# Patient Record
Sex: Male | Born: 1935 | Race: White | Hispanic: No | Smoking: Never smoker
Health system: Southern US, Community
[De-identification: ages and names within clinical notes are randomized; demographics above are authoritative.]

## PROBLEM LIST (undated history)

## (undated) DIAGNOSIS — K219 Gastro-esophageal reflux disease without esophagitis: Secondary | ICD-10-CM

## (undated) DIAGNOSIS — J45909 Unspecified asthma, uncomplicated: Secondary | ICD-10-CM

## (undated) DIAGNOSIS — R011 Cardiac murmur, unspecified: Secondary | ICD-10-CM

## (undated) DIAGNOSIS — I10 Essential (primary) hypertension: Secondary | ICD-10-CM

## (undated) DIAGNOSIS — E7800 Pure hypercholesterolemia, unspecified: Secondary | ICD-10-CM

## (undated) DIAGNOSIS — I2510 Atherosclerotic heart disease of native coronary artery without angina pectoris: Secondary | ICD-10-CM

## (undated) DIAGNOSIS — N400 Enlarged prostate without lower urinary tract symptoms: Secondary | ICD-10-CM

## (undated) DIAGNOSIS — I350 Nonrheumatic aortic (valve) stenosis: Secondary | ICD-10-CM

## (undated) HISTORY — DX: Unspecified asthma, uncomplicated: J45.909

## (undated) HISTORY — DX: Gastro-esophageal reflux disease without esophagitis: K21.9

## (undated) HISTORY — DX: Nonrheumatic aortic (valve) stenosis: I35.0

## (undated) HISTORY — PX: OTHER SURGICAL HISTORY: SHX169

## (undated) HISTORY — DX: Cardiac murmur, unspecified: R01.1

## (undated) HISTORY — DX: Atherosclerotic heart disease of native coronary artery without angina pectoris: I25.10

## (undated) HISTORY — DX: Benign prostatic hyperplasia without lower urinary tract symptoms: N40.0

## (undated) HISTORY — PX: CARDIAC CATHETERIZATION: SHX172

---

## 2001-07-15 ENCOUNTER — Emergency Department (HOSPITAL_COMMUNITY): Admission: EM | Admit: 2001-07-15 | Discharge: 2001-07-15 | Payer: Self-pay | Admitting: Emergency Medicine

## 2001-07-15 ENCOUNTER — Encounter: Payer: Self-pay | Admitting: Emergency Medicine

## 2001-07-31 ENCOUNTER — Ambulatory Visit (HOSPITAL_COMMUNITY): Admission: RE | Admit: 2001-07-31 | Discharge: 2001-07-31 | Payer: Self-pay | Admitting: Internal Medicine

## 2008-07-31 ENCOUNTER — Ambulatory Visit (HOSPITAL_COMMUNITY): Admission: RE | Admit: 2008-07-31 | Discharge: 2008-07-31 | Payer: Self-pay | Admitting: Family Medicine

## 2008-09-22 DIAGNOSIS — K219 Gastro-esophageal reflux disease without esophagitis: Secondary | ICD-10-CM

## 2008-09-22 DIAGNOSIS — Z8719 Personal history of other diseases of the digestive system: Secondary | ICD-10-CM

## 2008-09-22 DIAGNOSIS — R972 Elevated prostate specific antigen [PSA]: Secondary | ICD-10-CM | POA: Insufficient documentation

## 2008-09-22 DIAGNOSIS — K649 Unspecified hemorrhoids: Secondary | ICD-10-CM | POA: Insufficient documentation

## 2008-09-23 ENCOUNTER — Ambulatory Visit: Payer: Self-pay | Admitting: Internal Medicine

## 2008-09-24 ENCOUNTER — Encounter: Payer: Self-pay | Admitting: Internal Medicine

## 2008-10-19 ENCOUNTER — Ambulatory Visit: Payer: Self-pay | Admitting: Internal Medicine

## 2008-10-19 ENCOUNTER — Ambulatory Visit (HOSPITAL_COMMUNITY): Admission: RE | Admit: 2008-10-19 | Discharge: 2008-10-19 | Payer: Self-pay | Admitting: Internal Medicine

## 2008-12-31 ENCOUNTER — Emergency Department (HOSPITAL_COMMUNITY): Admission: EM | Admit: 2008-12-31 | Discharge: 2008-12-31 | Payer: Self-pay | Admitting: Emergency Medicine

## 2009-01-15 ENCOUNTER — Emergency Department (HOSPITAL_COMMUNITY): Admission: EM | Admit: 2009-01-15 | Discharge: 2009-01-15 | Payer: Self-pay | Admitting: Emergency Medicine

## 2009-01-21 ENCOUNTER — Encounter (INDEPENDENT_AMBULATORY_CARE_PROVIDER_SITE_OTHER): Payer: Self-pay | Admitting: Urology

## 2009-01-21 ENCOUNTER — Inpatient Hospital Stay (HOSPITAL_COMMUNITY): Admission: RE | Admit: 2009-01-21 | Discharge: 2009-01-24 | Payer: Self-pay | Admitting: Urology

## 2009-02-06 ENCOUNTER — Emergency Department (HOSPITAL_COMMUNITY): Admission: EM | Admit: 2009-02-06 | Discharge: 2009-02-06 | Payer: Self-pay | Admitting: Emergency Medicine

## 2009-02-11 ENCOUNTER — Emergency Department (HOSPITAL_COMMUNITY): Admission: EM | Admit: 2009-02-11 | Discharge: 2009-02-11 | Payer: Self-pay | Admitting: Emergency Medicine

## 2009-02-12 ENCOUNTER — Encounter (HOSPITAL_COMMUNITY): Admission: RE | Admit: 2009-02-12 | Discharge: 2009-04-23 | Payer: Self-pay | Admitting: Family Medicine

## 2009-02-12 ENCOUNTER — Inpatient Hospital Stay (HOSPITAL_COMMUNITY): Admission: EM | Admit: 2009-02-12 | Discharge: 2009-02-15 | Payer: Self-pay | Admitting: Emergency Medicine

## 2009-02-27 ENCOUNTER — Emergency Department (HOSPITAL_COMMUNITY): Admission: EM | Admit: 2009-02-27 | Discharge: 2009-02-27 | Payer: Self-pay | Admitting: Emergency Medicine

## 2009-03-17 ENCOUNTER — Emergency Department (HOSPITAL_COMMUNITY): Admission: EM | Admit: 2009-03-17 | Discharge: 2009-03-17 | Payer: Self-pay | Admitting: Emergency Medicine

## 2010-05-15 ENCOUNTER — Encounter: Payer: Self-pay | Admitting: Internal Medicine

## 2010-07-27 LAB — URINE CULTURE
Colony Count: NO GROWTH
Culture: NO GROWTH
Culture: NO GROWTH

## 2010-07-27 LAB — URINALYSIS, ROUTINE W REFLEX MICROSCOPIC
Bilirubin Urine: NEGATIVE
Bilirubin Urine: NEGATIVE
Glucose, UA: NEGATIVE mg/dL
Glucose, UA: NEGATIVE mg/dL
Ketones, ur: NEGATIVE mg/dL
Nitrite: NEGATIVE
Protein, ur: 30 mg/dL — AB
Specific Gravity, Urine: 1.025 (ref 1.005–1.030)
Specific Gravity, Urine: >1.03 — ABNORMAL HIGH (ref 1.005–1.030)
Urobilinogen, UA: 0.2 mg/dL (ref 0.0–1.0)
pH: 5.5 (ref 5.0–8.0)
pH: 6 (ref 5.0–8.0)

## 2010-07-27 LAB — BASIC METABOLIC PANEL WITH GFR
CO2: 23 meq/L (ref 19–32)
Chloride: 101 meq/L (ref 96–112)
GFR calc Af Amer: >60 mL/min (ref >60)
Potassium: 3.7 meq/L (ref 3.5–5.1)
Sodium: 130 meq/L — ABNORMAL LOW (ref 135–145)

## 2010-07-27 LAB — DIFFERENTIAL
Basophils Absolute: 0.1 10*3/uL (ref 0.0–0.1)
Basophils Absolute: 0 10*3/uL (ref 0.0–0.1)
Basophils Relative: 0 % (ref 0–1)
Eosinophils Absolute: 0 10*3/uL (ref 0.0–0.7)
Eosinophils Absolute: 0.1 K/uL (ref 0.0–0.7)
Eosinophils Relative: 0 % (ref 0–5)
Eosinophils Relative: 1 % (ref 0–5)
Lymphocytes Relative: 7 % — ABNORMAL LOW (ref 12–46)
Lymphocytes Relative: 8 % — ABNORMAL LOW (ref 12–46)
Lymphs Abs: 0.7 10*3/uL (ref 0.7–4.0)
Lymphs Abs: 0.6 10*3/uL — ABNORMAL LOW (ref 0.7–4.0)
Monocytes Absolute: 0.9 10*3/uL (ref 0.1–1.0)
Monocytes Absolute: 0.8 K/uL (ref 0.1–1.0)
Monocytes Relative: 10 % (ref 3–12)
Neutro Abs: 6.8 10*3/uL (ref 1.7–7.7)
Neutrophils Relative %: 81 % — ABNORMAL HIGH (ref 43–77)

## 2010-07-27 LAB — URINE MICROSCOPIC-ADD ON

## 2010-07-27 LAB — COMPREHENSIVE METABOLIC PANEL
ALT: 12 U/L (ref 0–53)
AST: 15 U/L (ref 0–37)
Albumin: 2.9 g/dL — ABNORMAL LOW (ref 3.5–5.2)
CO2: 27 mEq/L (ref 19–32)
Chloride: 109 mEq/L (ref 96–112)
GFR calc Af Amer: >60 mL/min (ref >60)
GFR calc non Af Amer: >60 mL/min (ref >60)
Sodium: 139 mEq/L (ref 135–145)
Total Bilirubin: 0.5 mg/dL (ref 0.3–1.2)

## 2010-07-27 LAB — BASIC METABOLIC PANEL
BUN: 24 mg/dL — ABNORMAL HIGH (ref 6–23)
Calcium: 8.5 mg/dL (ref 8.4–10.5)
Creatinine, Ser: 1.11 mg/dL (ref 0.4–1.5)
GFR calc non Af Amer: >60 mL/min (ref >60)
Glucose, Bld: 138 mg/dL — ABNORMAL HIGH (ref 70–99)

## 2010-07-27 LAB — CBC
HCT: 37.8 % — ABNORMAL LOW (ref 39.0–52.0)
Hemoglobin: 13.1 g/dL (ref 13.0–17.0)
MCHC: 34.7 g/dL (ref 30.0–36.0)
MCV: 88.5 fL (ref 78.0–100.0)
Platelets: 160 10*3/uL (ref 150–400)
Platelets: 188 10*3/uL (ref 150–400)
RBC: 3.82 MIL/uL — ABNORMAL LOW (ref 4.22–5.81)
RBC: 4.27 MIL/uL (ref 4.22–5.81)
RDW: 14.7 % (ref 11.5–15.5)
WBC: 10.4 10*3/uL (ref 4.0–10.5)
WBC: 8.3 10*3/uL (ref 4.0–10.5)

## 2010-07-28 LAB — DIFFERENTIAL
Basophils Absolute: 0 K/uL (ref 0.0–0.1)
Basophils Relative: 0 % (ref 0–1)
Eosinophils Absolute: 0.2 K/uL (ref 0.0–0.7)
Eosinophils Relative: 2 % (ref 0–5)
Eosinophils Relative: 1 % (ref 0–5)
Eosinophils Relative: 3 % (ref 0–5)
Lymphocytes Relative: 13 % (ref 12–46)
Lymphocytes Relative: 22 % (ref 12–46)
Lymphocytes Relative: 15 % (ref 12–46)
Lymphs Abs: 0.8 10*3/uL (ref 0.7–4.0)
Lymphs Abs: 1.6 10*3/uL (ref 0.7–4.0)
Lymphs Abs: 1.1 K/uL (ref 0.7–4.0)
Monocytes Absolute: 0.7 10*3/uL (ref 0.1–1.0)
Monocytes Absolute: 0.9 K/uL (ref 0.1–1.0)
Monocytes Relative: 12 % (ref 3–12)
Neutro Abs: 5.3 K/uL (ref 1.7–7.7)
Neutrophils Relative %: 70 % (ref 43–77)

## 2010-07-28 LAB — COMPREHENSIVE METABOLIC PANEL
AST: 18 U/L (ref 0–37)
AST: 18 U/L (ref 0–37)
Albumin: 3.4 g/dL — ABNORMAL LOW (ref 3.5–5.2)
Alkaline Phosphatase: 56 U/L (ref 39–117)
BUN: 15 mg/dL (ref 6–23)
CO2: 26 mEq/L (ref 19–32)
Calcium: 9.1 mg/dL (ref 8.4–10.5)
Creatinine, Ser: 1.05 mg/dL (ref 0.4–1.5)
Creatinine, Ser: 1.09 mg/dL (ref 0.4–1.5)
GFR calc Af Amer: >60 mL/min (ref >60)
GFR calc Af Amer: >60 mL/min (ref >60)
GFR calc non Af Amer: >60 mL/min (ref >60)
Potassium: 4 mEq/L (ref 3.5–5.1)
Total Protein: 6.8 g/dL (ref 6.0–8.3)

## 2010-07-28 LAB — SEDIMENTATION RATE: Sed Rate: 25 mm/hr — ABNORMAL HIGH (ref 0–16)

## 2010-07-28 LAB — CBC
HCT: 39 % (ref 39.0–52.0)
HCT: 39.3 % (ref 39.0–52.0)
HCT: 36 % — ABNORMAL LOW (ref 39.0–52.0)
Hemoglobin: 13.5 g/dL (ref 13.0–17.0)
Hemoglobin: 12.6 g/dL — ABNORMAL LOW (ref 13.0–17.0)
MCHC: 34.7 g/dL (ref 30.0–36.0)
MCHC: 35.2 g/dL (ref 30.0–36.0)
MCV: 88.7 fL (ref 78.0–100.0)
MCV: 88 fL (ref 78.0–100.0)
Platelets: 249 10*3/uL (ref 150–400)
Platelets: 315 10*3/uL (ref 150–400)
Platelets: 150 K/uL (ref 150–400)
RBC: 4.6 MIL/uL (ref 4.22–5.81)
RBC: 4.09 MIL/uL — ABNORMAL LOW (ref 4.22–5.81)
RDW: 12.9 % (ref 11.5–15.5)
RDW: 13.4 % (ref 11.5–15.5)
WBC: 6.4 10*3/uL (ref 4.0–10.5)
WBC: 7.6 K/uL (ref 4.0–10.5)

## 2010-07-28 LAB — URINALYSIS, ROUTINE W REFLEX MICROSCOPIC
Bilirubin Urine: NEGATIVE
Bilirubin Urine: NEGATIVE
Glucose, UA: NEGATIVE mg/dL
Glucose, UA: NEGATIVE mg/dL
Ketones, ur: NEGATIVE mg/dL
Ketones, ur: 15 mg/dL — AB
Ketones, ur: 15 mg/dL — AB
Nitrite: NEGATIVE
Nitrite: NEGATIVE
Protein, ur: 100 mg/dL — AB
Protein, ur: 100 mg/dL — AB
Specific Gravity, Urine: 1.01 (ref 1.005–1.030)
Specific Gravity, Urine: 1.019 (ref 1.005–1.030)
Specific Gravity, Urine: 1.02 (ref 1.005–1.030)
Urobilinogen, UA: 0.2 mg/dL (ref 0.0–1.0)
Urobilinogen, UA: 0.2 mg/dL (ref 0.0–1.0)
Urobilinogen, UA: 0.2 mg/dL (ref 0.0–1.0)
pH: 6 (ref 5.0–8.0)
pH: 6 (ref 5.0–8.0)

## 2010-07-28 LAB — POCT CARDIAC MARKERS
CKMB, poc: <1 ng/mL — ABNORMAL LOW (ref 1.0–8.0)
Myoglobin, poc: 89.3 ng/mL (ref 12–200)
Troponin i, poc: <0.05 ng/mL (ref 0.00–0.09)

## 2010-07-28 LAB — URINE CULTURE: Colony Count: 50000

## 2010-07-28 LAB — BASIC METABOLIC PANEL
BUN: 15 mg/dL (ref 6–23)
BUN: 10 mg/dL (ref 6–23)
CO2: 25 mEq/L (ref 19–32)
CO2: 28 mEq/L (ref 19–32)
Calcium: 8.3 mg/dL — ABNORMAL LOW (ref 8.4–10.5)
Chloride: 99 mEq/L (ref 96–112)
Chloride: 99 mEq/L (ref 96–112)
Creatinine, Ser: 1.15 mg/dL (ref 0.4–1.5)
Creatinine, Ser: 0.89 mg/dL (ref 0.4–1.5)
GFR calc non Af Amer: >60 mL/min (ref >60)
Glucose, Bld: 109 mg/dL — ABNORMAL HIGH (ref 70–99)
Glucose, Bld: 126 mg/dL — ABNORMAL HIGH (ref 70–99)
Glucose, Bld: 81 mg/dL (ref 70–99)
Potassium: 3.5 mEq/L (ref 3.5–5.1)
Sodium: 134 mEq/L — ABNORMAL LOW (ref 135–145)

## 2010-07-28 LAB — URINE MICROSCOPIC-ADD ON

## 2010-07-28 LAB — PROTIME-INR
INR: 1 (ref 0.00–1.49)
Prothrombin Time: 13.1 s (ref 11.6–15.2)

## 2010-07-28 LAB — CA 125: CA 125: 10.1 U/mL (ref 0.0–30.2)

## 2010-07-28 LAB — PSA: PSA: 10.02 ng/mL — ABNORMAL HIGH (ref 0.10–4.00)

## 2010-07-28 LAB — CANCER ANTIGEN 19-9: CA 19-9: 16.7 U/mL — ABNORMAL LOW

## 2010-07-28 LAB — CEA: CEA: <0.5 ng/mL (ref 0.0–5.0)

## 2010-07-28 LAB — T3, FREE: T3, Free: 3.1 pg/mL (ref 2.3–4.2)

## 2010-07-28 LAB — T4, FREE: Free T4: 1.33 ng/dL (ref 0.80–1.80)

## 2010-07-28 LAB — TSH
TSH: 0.636 u[IU]/mL (ref 0.350–4.500)
TSH: 0.766 u[IU]/mL (ref 0.350–4.500)

## 2010-07-29 LAB — BASIC METABOLIC PANEL
BUN: 14 mg/dL (ref 6–23)
Chloride: 105 mEq/L (ref 96–112)
Potassium: 4.3 mEq/L (ref 3.5–5.1)
Sodium: 136 mEq/L (ref 135–145)

## 2010-07-29 LAB — URINALYSIS, ROUTINE W REFLEX MICROSCOPIC
Bilirubin Urine: NEGATIVE
Bilirubin Urine: NEGATIVE
Ketones, ur: NEGATIVE mg/dL
Nitrite: NEGATIVE
Nitrite: NEGATIVE
Protein, ur: NEGATIVE mg/dL
Specific Gravity, Urine: 1.01 (ref 1.005–1.030)
Specific Gravity, Urine: 1.01 (ref 1.005–1.030)
Urobilinogen, UA: 0.2 mg/dL (ref 0.0–1.0)
Urobilinogen, UA: 0.2 mg/dL (ref 0.0–1.0)

## 2010-07-29 LAB — CBC
HCT: 41.2 % (ref 39.0–52.0)
Hemoglobin: 14.4 g/dL (ref 13.0–17.0)
MCV: 89 fL (ref 78.0–100.0)
RBC: 4.63 MIL/uL (ref 4.22–5.81)
WBC: 6.6 10*3/uL (ref 4.0–10.5)

## 2010-07-29 LAB — URINE CULTURE: Culture: NO GROWTH

## 2010-08-01 LAB — URINALYSIS, ROUTINE W REFLEX MICROSCOPIC
Bilirubin Urine: NEGATIVE
Leukocytes, UA: NEGATIVE
Nitrite: NEGATIVE
Specific Gravity, Urine: 1.01 (ref 1.005–1.030)
Urobilinogen, UA: 0.2 mg/dL (ref 0.0–1.0)
pH: 5.5 (ref 5.0–8.0)

## 2010-08-01 LAB — URINE MICROSCOPIC-ADD ON

## 2010-09-06 NOTE — Op Note (Signed)
NAME:  Ryan Hansen, Ryan Hansen                 ACCOUNT NO.:  [id number]   MEDICAL RECORD NO.:  [id number]          PATIENT TYPE:  AMB   LOCATION:  DAY                           FACILITY:  [facility]   PHYSICIAN:  R. Roetta Sessions, M.D. DATE OF BIRTH:  01/13/1929   DATE OF PROCEDURE:  10/19/2008  DATE OF DISCHARGE:                               OPERATIVE REPORT   PROCEDURE PERFORMED:  Ileocolonoscopy diagnostic.   INDICATIONS FOR PROCEDURE:  A 77 year old gentleman with intermittent  hematochezia when he strains.  He does take Metamucil daily.  Last  colonoscopy was in 2003.  He has had some left-sided proctocolitis at  that time and diverticulosis.  Colonoscopy is now being done.  Risks,  benefits, alternatives and limitations have been reviewed, questions  answered.  In the past passed 24 hours, he has had some voiding  difficulties and has had significant increased urinary urgency over the  past 12 hours but no dysuria, no flank pain, no fever.  He has a history  of BPH and is followed by Dr. Rito Ehrlich.  Preop we obtained urine  specimen and placed a Foley catheter for the procedure as he had the  urge to go urinate about every minute.   He had 300 mL of urine in his bladder on postvoid residual.  His  urinalysis came back normal.  This approach has discussed Mr. Langenbach.  Risks, benefits, alternatives and limitations have been reviewed,  questions answered.  Please see documentation in the medical record.   PROCEDURE NOTE:  O2 saturation, blood pressure, pulse and respirations  monitored throughout the entire procedure.  Conscious sedation was  Versed 4 mg IV, Demerol 75 mg IV in divided doses.  Instrument was  Pentax video chip system.   FINDINGS:  Digital rectal exam revealed a friable anal canal and a  single hemorrhoidal tag.  The prep was adequate.  Colon:  Colonic mucosa was surveyed from the rectosigmoid junction  through the left, transverse, right colon to the area of appendiceal  orifice, ileocecal valve/cecum.  These structures were well seen and  photographed for the record.  Terminal ileum was intubated 10 cm.  From  this level scope was slowly and cautiously withdrawn.  All previously  mentioned mucosal surfaces were again seen.  The patient had left-sided  transverse diverticula, some subtle submucosal petechiae around a few  sigmoid diverticula.  Remainder of colonic mucosa appeared normal as did  terminal ileal mucosa.  Scope was pulled down the rectum where thorough  examination of the rectal mucosa including en face view of the anal  canal demonstrated a friable anal canal and a single hemorrhoidal tag  only.  The patient tolerated the procedure well, was reacted in  endoscopy.  Cecal withdrawal time 7 minutes.   IMPRESSION:  1. Friable anal canal with a single external/anal canal hemorrhoidal      tag, otherwise normal rectum.  2. Left-sided transverse diverticula with submucosal petechiae about      several sigmoid diverticula, doubtful clinical significance,      otherwise normal colon, normal terminal ileum.   RECOMMENDATIONS:  1. Continue Metamucil one dose daily.  2. Add Colace 100 mg orally twice daily as a stool softener, Anusol-HC      suppositories one per rectum at bedtime times 10 days.  3. MiraLax 17 grams orally bedtime p.r.n. constipation.  4. The patient is to notify me if rectal bleeding does not cease.  5. The patient is to see Dr. Rito Ehrlich as soon as can be arranged for      his voiding difficulties.      Jonathon Bellows, M.D.  Electronically Signed     RMR/MEDQ  D:  10/19/2008  T:  10/19/2008  Job:  [id number]   cc:   Dr. Wynona Neat, M.D.  Fax: [phone]

## 2010-09-09 NOTE — Op Note (Signed)
[facility]  Patient:    Ryan Hansen, Ryan Hansen Visit Number: [id number] MRN: [id number]          Service Type: DSU Location: DAY Attending Physician:  Jonathon Bellows Dictated by:   Roetta Sessions, M.D. Proc. Date: 07/31/01 Admit Date:  07/31/2001 Discharge Date: 07/31/2001   CC:         Scarlett Presto, M.D.   Operative Report  PROCEDURE:  Colonoscopy, biopsy, stool sampling.  ENDOSCOPIST:  Roetta Sessions, M.D.  INDICATIONS FOR PROCEDURE:  A 77 year old gentleman with recent episodes of rectal bleeding, has three to four soft bowel movements daily, but really denies frank diarrhea.  Colonoscopy is now being done to further evaluate his symptoms.  Approach has been discussed with Mr. Patchell yesterday in my office and, again, today at the bedside.  Potential risks, benefits, and alternatives have been reviewed.  He tells me he has "things to do" later today and wishes to not have any conscious sedation.  Please see my dictated H&P for more information.  PROCEDURE NOTE:  O2 saturation, blood pressure, pulse, and respirations were monitored throughout the entire procedure.  CONSCIOUS SEDATION:  None given.  INSTRUMENT:  Olympus video colonoscope.  FINDINGS:  Digital rectal examination revealed no abnormalities.  ______ findings - the prep was good.  On rectal mucosal examination, rectal mucosa was clear.  A retroflexed view of anal verge revealed no significant hemorrhoidal tissue.  However, the rectal mucosa was abnormal.  A normal vascular pattern was preserved.  However, there were "islands" of focal fairly intense erythema with little white pustules overlying them.  These were diffusely distributed throughout the rectal mucosa.  This was extended into the sigmoid colon to 35 cm.  More proximally, the mucosa appeared entirely normal except for sigmoid diverticula.  I was able to easily advance the scope to the cecum.  The patient tolerated the  advancement very well.  The cecum, ileocecal valve, appendiceal orifices were well seen and photographed for the record.  Stool residue was suctioned for microbiology studies from the level of the cecum and ileocecal valve.  The scope was slowly withdrawn.  All previously mucosal surfaces were again seen.  No other abnormalities were observed.  Biopsies of the sigmoid and rectal mucosa were taken for histologic study.  The patient tolerated the procedure extremely well and was discharged.  IMPRESSION: 1. Abnormal-appearing rectosigmoid to 35 cm as described above consistent with    endoscopic colitis.  Findings were nonspecific.  This could be idiopathic    or an infectious process.  Biopsies taken.  Stool sample taken. 2. Left-sided diverticula. 3. Remainder of the more proximal colon all the way to the cecum appeared    endoscopically normal.  RECOMMENDATIONS:  Follow-up on stool studies and biopsies.  Further recommendations to follow. Dictated by:   Roetta Sessions, M.D. Attending Physician:  Jonathon Bellows DD:  07/31/01 TD:  07/31/01 Job: [id number]

## 2010-09-09 NOTE — Consult Note (Signed)
[facility]  Patient:    Ryan Hansen, Ryan Hansen Visit Number: [id number] MRN: [id number]          Service Type: EMS Location: ED Attending Physician:  Hilario Quarry Dictated by:   Pearletha Furl Alanda Amass, M.D. Admit Date:  07/15/2001 Discharge Date: 07/15/2001   CC:         Scarlett Presto, M.D.  [facility] Patient Room   Consultation Report  HISTORY OF PRESENT ILLNESS:  The patient is a very pleasant, 77 year old white married father of 3 with 2 grandchildren. 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 His wife has been out of town over the last several days at [employer], and he has been living alone and eating and cooking for himself.  The patient is followed by Dr. Malvin Johns periodically as an outpatient, but he has not required any recent surgery. He is seen at the request of Dr. Malvin Johns for evaluation of an episode of chest pain.  The patient felt well when he went to bed last night. He ate a tomato sandwich with his dinner. He awakened approximately 3 a.m. with upper substernal pressure that was nonradiating. There was no nausea, vomiting or diaphoresis. He tried to take an antacid and sit up. That took about 20-30 minutes for the discomfort to wear off. He felt that it was probably indigestion, but he was concerned about it. He was able to go back to bed, and he saw Dr. Malvin Johns today. In view of his family history of coronary disease, it was felt best to send him over to the emergency room for further evaluation and cardiac consultation was requested through the courtesy of Dr. Malvin Johns.  The patient was pain free in the emergency room and stable hemodynamically.  PAST MEDICAL HISTORY:  The patient has twin brothers who are age 7, and both have required coronary stents in the past. He has no history of known hyperlipidemia.  There is no diabetes and no prior history to suggest coronary disease. He has not had any physiologic testing. He remains active and works part time as outlined above. He has not had any exertional symptoms.  He has had a history of intermittent reflux when he "eats the wrong food" that felt similar to this episode, however, it usually lasts for several seconds to minutes, so this was slightly more prolonged.  During the a.m. hours, the patient did take an aspirin.  PHYSICAL EXAMINATION:  VITAL SIGNS:  In the emergency room, he is stable with a blood pressure of 168/79 by the nurses. Repeat by me was 140/79. He was afebrile; pulse 70, respirations 24.  GENERAL:  He appears younger than his stated age of 40. He is euthyroid appearing and astenic. Color is good.  HEENT:  There were no xanthelasmas. Pupils are equal, round, and reactive to light. Extraocular movements are intact.  NECK:  Thyroid is not enlarged. There are no carotid bruits.  CHEST:  Clear to auscultation and percussion.  CARDIAC:  Reveals a localized LV heave in the left 6th ICS and the MCL. There is an S4 gallop that is audible but not palpable. There is no significant cardiac murmur, no S3, JVD, or HJ reflex.  EXTREMITIES:  His pedal pulses are intact. There is no edema. No leg or toe ulcerations. Femorals are intact without bruits.  ABDOMEN:  Liver, spleen and kidney not palpable. Abdomen is  nontender, specifically no right upper quadrant tenderness.  No pathologic reflexes.  RADIOLOGY:  EKG shows sinus rhythm and is essentially within normal limits.  LABORATORY AND ACCESSORY DATA:  Normal CPK of 144, normal MB of 1.3, and normal troponin of 0.02. The patient has been in the emergency room. H&H is normal, WBC 5.7, BUN/creatinine 14/0.9, potassium 4.2.  IMPRESSION:  The patient has had a single episode of chest pain. It is compatible with reflux and esophageal spasm and/or coronary. He is quite stable now.  There have been no recurrent episodes, and he has myocardial infarction essentially ruled out by serial enzymes and EKGs at this time now, almost 10 hours since his episode.  PLAN:  In view of the patients positive family history, I think we should have a further workup, and I think we can proceed without patient Cardiolite in this setting. In the interim because of his mild hypertension and possible suspicion of coronary disease, we will put him on low-dose beta blocker, Toprol XL 25, and add a PPI to his regimen along with aspirin. We will arrange for an outpatient Cardiolite to be done. If he has any recurrent episodes of pain, he is advised to call us and/or come to the emergency room and ask for our service. I have explained all of this to the patient in detail; further recommendations pending his outpatient Cardiolite and clinical status. Dictated by:   Pearletha Furl Alanda Amass, M.D. Attending Physician:  Hilario Quarry DD:  07/15/01 TD:  07/16/01 Job: [id number]

## 2010-09-09 NOTE — Consult Note (Signed)
[facility]  Patient:    Ryan Hansen, Ryan Hansen Visit Number: [id number] MRN: [id number]          Service Type: EMS Location: ED Attending Physician:  Hilario Quarry Dictated by:   Roetta Sessions, M.D. Admit Date:  07/15/2001 Discharge Date: 07/15/2001   CC:         Barbaraann Barthel, M.D.  Richard A. Alanda Amass, M.D.   Consultation Report  ROOM [id number]  DATE OF BIRTH: 1929/01/03  CHIEF COMPLAINT:  Hematochezia.  HISTORY OF PRESENT ILLNESS:  This patient is a pleasant 77 year old gentleman referred at the request of Dr. Barbaraann Barthel for recent episode of painless hematochezia.  The patient tells me that he moves his bowels fairly regularly, at least once daily, but just recently he had a gross amount of blood in the toilet water after having a bowel movement.  He saw Dr. Malvin Johns earlier today who performed an anoscopy who felt that he had some internal hemorrhoids but no sentinel lesion.  The patient tells me that Dr. Karilyn Cota did a colonoscopy on him some 10 years ago for similar symptoms and no significant abnormalities were found.  There is no family history of colorectal neoplasia.  He has not had his colon imaged since that time.  There is no family history of colorectal neoplasia.  He has no associated abdominal pain, diarrhea or constipation.  He has had some atypical chest pain recently for which he saw Dr. Alanda Amass.  He is scheduled to have a Cardiolite study done next week.   Dr. Malvin Johns asked me to consider doing a colonoscopy on this gentleman prior to his Cardiolite study.  The patient has not had any recent chest pain or dyspnea.  PAST MEDICAL HISTORY:  Significant for having gastroesophageal reflux symptoms, elevated PSA.  He denies having any prior surgeries, prior colonoscopy as outlined above.  CURRENT MEDICATIONS:  Vitamin C, vitamin E supplement saw palmetto daily.  ALLERGIES:  NOVOCAIN causes anxiousness and  nervousness.  FAMILY HISTORY:  Father died, at a young age, of a "bleeding ulcer."  He consumed alcohol in large quantities per the patient.  Mother is 51 in relatively good health.  No history of chronic GI or liver illness otherwise.  SOCIAL HISTORY:  The patient is married for 38 years, has 3 children.  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  He does not use tobacco or alcohol.  REVIEW OF SYSTEMS:  No recent chest pain on exertion.  No dyspnea, no fever, chills, no change in weight.  PHYSICAL EXAMINATION:  GENERAL:  Reveals a pleasant, well-groomed 77 year old gentleman resting comfortably.  VITAL SIGNS:  Weight 170, height 6 feet 1 inch.  Temperature 96.3, BP 130/80, pulse 74.  SKIN:  Warm and dry no lesions.  HEENT:  No scleral icterus.  Oral cavity no lesions.  He has a dental place present.  NECK:  JVD is not prominent.  CHEST:  Lungs are clear to auscultation.  CARDIAC:  Regular rate and rhythm without murmur, gallop, or rub.  ABDOMEN:  Nondistended, positive bowel sounds.  Soft, nontender, without appreciable mass or organomegaly.  RECTAL:  Deferred until the time of colonoscopy.  IMPRESSION:  This patient is a 77 year old gentleman with recent hematochezia.  It has been 10 years since he had a colonoscopy.  I agree with Dr. Malvin Johns.  He will have a colonoscopy now.  I have discussed this approach with the patient, the potential risks, benefits, and alternatives have  been reviewed, questions answered.  He is agreeable.  PLAN:  Perform colonoscopy in the very near future for evaluation.  I understand that the patient is going to [facility] to have some labs drawn after he leaves here.  We will plan to perform colonoscopy tomorrow at [facility]  I would like to thank Dr. Barbaraann Barthel for allowing me to see this nice gentleman today. Dictated by:   Roetta Sessions, M.D. Attending Physician:  Hilario Quarry DD:   07/30/01 TD:  07/30/01 Job: [id number]

## 2010-12-15 ENCOUNTER — Other Ambulatory Visit (HOSPITAL_COMMUNITY): Payer: Self-pay | Admitting: Urology

## 2010-12-15 DIAGNOSIS — R972 Elevated prostate specific antigen [PSA]: Secondary | ICD-10-CM

## 2010-12-15 DIAGNOSIS — N400 Enlarged prostate without lower urinary tract symptoms: Secondary | ICD-10-CM

## 2010-12-15 DIAGNOSIS — R3129 Other microscopic hematuria: Secondary | ICD-10-CM

## 2010-12-19 ENCOUNTER — Encounter (HOSPITAL_COMMUNITY): Payer: Self-pay

## 2010-12-19 ENCOUNTER — Ambulatory Visit (HOSPITAL_COMMUNITY)
Admission: RE | Admit: 2010-12-19 | Discharge: 2010-12-19 | Disposition: A | Discharge status: Home / Self Care | Payer: Medicare Other | Source: Ambulatory Visit | Attending: Urology | Admitting: Urology

## 2010-12-19 DIAGNOSIS — R972 Elevated prostate specific antigen [PSA]: Secondary | ICD-10-CM | POA: Insufficient documentation

## 2010-12-19 DIAGNOSIS — N200 Calculus of kidney: Secondary | ICD-10-CM | POA: Insufficient documentation

## 2010-12-19 DIAGNOSIS — K5730 Diverticulosis of large intestine without perforation or abscess without bleeding: Secondary | ICD-10-CM | POA: Insufficient documentation

## 2010-12-19 DIAGNOSIS — R3129 Other microscopic hematuria: Secondary | ICD-10-CM | POA: Insufficient documentation

## 2010-12-19 DIAGNOSIS — N400 Enlarged prostate without lower urinary tract symptoms: Secondary | ICD-10-CM | POA: Insufficient documentation

## 2010-12-19 MED ORDER — IOHEXOL 300 MG/ML  SOLN
125.0000 mL | Freq: Once | INTRAMUSCULAR | Status: AC | PRN
  Administered 2010-12-19: 125 mL via INTRAVENOUS

## 2012-06-07 ENCOUNTER — Emergency Department (HOSPITAL_COMMUNITY)
Admission: EM | Admit: 2012-06-07 | Discharge: 2012-06-07 | Disposition: A | Discharge status: Home / Self Care | Payer: Medicare Other | Attending: Emergency Medicine | Admitting: Emergency Medicine

## 2012-06-07 ENCOUNTER — Emergency Department (HOSPITAL_COMMUNITY): Payer: Medicare Other

## 2012-06-07 ENCOUNTER — Encounter (HOSPITAL_COMMUNITY): Payer: Self-pay | Admitting: *Deleted

## 2012-06-07 DIAGNOSIS — J189 Pneumonia, unspecified organism: Secondary | ICD-10-CM | POA: Insufficient documentation

## 2012-06-07 DIAGNOSIS — R059 Cough, unspecified: Secondary | ICD-10-CM | POA: Insufficient documentation

## 2012-06-07 DIAGNOSIS — I10 Essential (primary) hypertension: Secondary | ICD-10-CM | POA: Insufficient documentation

## 2012-06-07 DIAGNOSIS — R05 Cough: Secondary | ICD-10-CM | POA: Insufficient documentation

## 2012-06-07 HISTORY — DX: Essential (primary) hypertension: I10

## 2012-06-07 HISTORY — DX: Pure hypercholesterolemia, unspecified: E78.00

## 2012-06-07 MED ORDER — AZITHROMYCIN 250 MG PO TABS: ORAL_TABLET | ORAL | Status: DC

## 2012-06-07 MED ORDER — AZITHROMYCIN 250 MG PO TABS
500.0000 mg | ORAL_TABLET | Freq: Once | ORAL | Status: AC
  Administered 2012-06-07: 500 mg via ORAL
  Filled 2012-06-07: qty 2

## 2012-06-07 NOTE — ED Notes (Signed)
Pt has been coughing very hard lately, coughing up blood per pt, in triage pt showed me a napkin with minor amt of blood on tissue. Pt denies being on any blood thinners.

## 2012-06-07 NOTE — ED Provider Notes (Signed)
History     This chart was scribed for Donnetta Hutching, MD, MD by Smitty Pluck, ED Scribe. The patient was seen in room APA07/APA07 and the patient's care was started at 9:04 PM.   CSN: [id number]  Arrival date & time 06/07/12  1816    Chief Complaint  Patient presents with  . Hemoptysis  . Cough    The history is provided by the patient. No language interpreter was used.   Ryan Hansen is a 79 y.o. male who presents to the Emergency Department complaining of constant, mild hemoptysis onset onset 4 days ago. Pt denies any health complications. He did not take any mediation PTA. Pt denies fever, chills, nausea, vomiting, diarrhea, weakness, SOB and any other pain. Denies smoking cigarettes and drinking alcohol.   PCP is Dr. Phillips Odor   Past Medical History  Diagnosis Date  . Elevated cholesterol   . Hypertension     History reviewed. No pertinent past surgical history.  History reviewed. No pertinent family history.  History  Substance Use Topics  . Smoking status: Never Smoker   . Smokeless tobacco: Not on file  . Alcohol Use: No      Review of Systems 10 Systems reviewed and all are negative for acute change except as noted in the HPI.   Allergies  Procaine hcl  Home Medications  No current outpatient prescriptions on file.  BP 141/74  Pulse 67  Temp(Src) 98.5 F (36.9 C) (Oral)  Ht 6' (1.829 m)  Wt 160 lb (72.576 kg)  BMI 21.7 kg/m2  SpO2 94%  Physical Exam  Nursing note and vitals reviewed. Constitutional: He is oriented to person, place, and time. He appears well-developed and well-nourished.  HENT:  Head: Normocephalic and atraumatic.  Eyes: Conjunctivae and EOM are normal. Pupils are equal, round, and reactive to light.  Neck: Normal range of motion. Neck supple.  Cardiovascular: Normal rate, regular rhythm and normal heart sounds.   Pulmonary/Chest: Effort normal and breath sounds normal. No respiratory distress.  Abdominal: Soft. Bowel sounds are  normal.  Musculoskeletal: Normal range of motion.  Neurological: He is alert and oriented to person, place, and time.  Skin: Skin is warm and dry.  Psychiatric: He has a normal mood and affect.    ED Course  Procedures (including critical care time) DIAGNOSTIC STUDIES: Oxygen Saturation is 94% on room air, adequate by my interpretation.    COORDINATION OF CARE: 9:07 PM Discussed ED treatment with pt and pt agrees. (abx)     Labs Reviewed - No data to display Dg Chest 2 View  06/07/2012  *RADIOLOGY REPORT*  Clinical Data: Hemoptysis, cough.  CHEST - 2 VIEW  Comparison: 03/17/2009  Findings: There is hyperinflation of the lungs compatible with COPD.  Left basilar airspace opacity concerning for pneumonia. Right lung is clear.  Heart is normal size.  No effusions or acute bony abnormality.  IMPRESSION: COPD.  Findings suspicious for left lower lobe pneumonia.   Original Report Authenticated By: Charlett Nose, M.D.      No diagnosis found.    MDM  History and physical consistent with community-acquired pneumonia. Rx Zithromax. Patient is hemodynamically stable. Good color. No dyspnea. Ambulatory      I personally performed the services described in this documentation, which was scribed in my presence. The recorded information has been reviewed and is accurate.    Donnetta Hutching, MD 06/07/12 2138

## 2012-11-15 ENCOUNTER — Other Ambulatory Visit: Payer: Self-pay | Admitting: *Deleted

## 2012-11-15 DIAGNOSIS — R011 Cardiac murmur, unspecified: Secondary | ICD-10-CM

## 2012-11-21 ENCOUNTER — Encounter: Payer: Self-pay | Admitting: Cardiovascular Disease

## 2012-11-21 ENCOUNTER — Other Ambulatory Visit: Payer: Self-pay | Admitting: *Deleted

## 2012-11-22 ENCOUNTER — Ambulatory Visit (HOSPITAL_COMMUNITY): Payer: Medicare Other | Attending: Cardiovascular Disease

## 2012-11-26 ENCOUNTER — Telehealth (HOSPITAL_COMMUNITY): Payer: Self-pay | Admitting: Cardiovascular Disease

## 2012-12-03 ENCOUNTER — Telehealth (HOSPITAL_COMMUNITY): Payer: Self-pay | Admitting: Cardiovascular Disease

## 2012-12-03 NOTE — Telephone Encounter (Signed)
Left message with family to have the patient call back regarding scheduling

## 2012-12-04 ENCOUNTER — Other Ambulatory Visit: Payer: Self-pay | Admitting: Cardiovascular Disease

## 2012-12-04 LAB — CBC WITH DIFFERENTIAL/PLATELET
Basophils Absolute: 0 10*3/uL (ref 0.0–0.1)
Eosinophils Absolute: 0.5 10*3/uL (ref 0.0–0.7)
Eosinophils Relative: 8 % — ABNORMAL HIGH (ref 0–5)
Lymphocytes Relative: 31 % (ref 12–46)
MCV: 88.3 fL (ref 78.0–100.0)
Neutrophils Relative %: 50 % (ref 43–77)
Platelets: 171 10*3/uL (ref 150–400)
RBC: 4.79 MIL/uL (ref 4.22–5.81)
RDW: 13.6 % (ref 11.5–15.5)
WBC: 6.1 10*3/uL (ref 4.0–10.5)

## 2012-12-04 LAB — COMPREHENSIVE METABOLIC PANEL
ALT: 12 U/L (ref 0–53)
AST: 18 U/L (ref 0–37)
Chloride: 106 mEq/L (ref 96–112)
Creat: 0.94 mg/dL (ref 0.50–1.35)
Sodium: 140 mEq/L (ref 135–145)
Total Bilirubin: 0.8 mg/dL (ref 0.3–1.2)
Total Protein: 7.2 g/dL (ref 6.0–8.3)

## 2012-12-04 LAB — LIPID PANEL
Total CHOL/HDL Ratio: 4 Ratio
VLDL: 35 mg/dL (ref 0–40)

## 2012-12-04 LAB — PSA: PSA: 5.94 ng/mL — ABNORMAL HIGH (ref <=4.00)

## 2012-12-04 LAB — TSH: TSH: 2.339 u[IU]/mL (ref 0.350–4.500)

## 2012-12-06 ENCOUNTER — Telehealth (HOSPITAL_COMMUNITY): Payer: Self-pay | Admitting: Cardiovascular Disease

## 2012-12-17 ENCOUNTER — Ambulatory Visit (HOSPITAL_COMMUNITY)
Admission: RE | Admit: 2012-12-17 | Discharge: 2012-12-17 | Disposition: A | Discharge status: Home / Self Care | Payer: Medicare Other | Source: Ambulatory Visit | Attending: Cardiovascular Disease | Admitting: Cardiovascular Disease

## 2012-12-17 ENCOUNTER — Ambulatory Visit (HOSPITAL_COMMUNITY): Payer: Medicare Other

## 2012-12-17 DIAGNOSIS — R011 Cardiac murmur, unspecified: Secondary | ICD-10-CM | POA: Insufficient documentation

## 2012-12-17 NOTE — Progress Notes (Signed)
*  PRELIMINARY RESULTS* Echocardiogram 2D Echocardiogram has been performed.  Conrad Rayne 12/17/2012, 3:15 PM

## 2012-12-24 DIAGNOSIS — R011 Cardiac murmur, unspecified: Secondary | ICD-10-CM

## 2012-12-24 HISTORY — DX: Cardiac murmur, unspecified: R01.1

## 2013-06-23 ENCOUNTER — Other Ambulatory Visit: Payer: Self-pay | Admitting: *Deleted

## 2013-06-23 MED ORDER — METOPROLOL TARTRATE 25 MG PO TABS: 12.5000 mg | ORAL_TABLET | Freq: Every day | ORAL | Status: DC

## 2013-06-23 NOTE — Telephone Encounter (Signed)
Rx was sent to pharmacy electronically. 

## 2013-09-23 ENCOUNTER — Other Ambulatory Visit: Payer: Self-pay | Admitting: *Deleted

## 2013-09-23 MED ORDER — SIMVASTATIN 40 MG PO TABS: 40.0000 mg | ORAL_TABLET | Freq: Every day | ORAL | Status: DC

## 2013-09-23 NOTE — Telephone Encounter (Signed)
Rx was sent to pharmacy electronically. 

## 2013-11-24 ENCOUNTER — Other Ambulatory Visit: Payer: Self-pay | Admitting: Cardiology

## 2013-11-24 NOTE — Telephone Encounter (Signed)
Rx was sent to pharmacy electronically. 

## 2013-12-30 ENCOUNTER — Other Ambulatory Visit: Payer: Self-pay | Admitting: Cardiology

## 2013-12-30 NOTE — Telephone Encounter (Signed)
Rx was sent to pharmacy electronically. 

## 2014-01-02 ENCOUNTER — Other Ambulatory Visit: Payer: Self-pay | Admitting: Cardiology

## 2014-01-06 ENCOUNTER — Other Ambulatory Visit: Payer: Self-pay | Admitting: *Deleted

## 2014-01-06 ENCOUNTER — Other Ambulatory Visit: Payer: Self-pay | Admitting: Cardiology

## 2014-01-06 MED ORDER — METOPROLOL TARTRATE 25 MG PO TABS: 12.5000 mg | ORAL_TABLET | Freq: Every day | ORAL | Status: DC

## 2014-01-06 NOTE — Telephone Encounter (Signed)
Patient needs refill on Metoprolol Tartrate 25 mg # 15  1/2 tab BID

## 2014-01-06 NOTE — Telephone Encounter (Signed)
Rx refill sent to patient pharmacy   

## 2014-01-15 ENCOUNTER — Other Ambulatory Visit: Payer: Self-pay | Admitting: Cardiology

## 2014-01-15 NOTE — Telephone Encounter (Signed)
Rx was sent to pharmacy electronically. Appmt with Dr. Stanford Breed 02/18/14

## 2014-02-12 ENCOUNTER — Other Ambulatory Visit: Payer: Self-pay | Admitting: Cardiology

## 2014-02-12 NOTE — Telephone Encounter (Signed)
Rx was sent to pharmacy electronically. 

## 2014-02-18 ENCOUNTER — Ambulatory Visit (INDEPENDENT_AMBULATORY_CARE_PROVIDER_SITE_OTHER): Payer: Medicare Other | Admitting: Cardiology

## 2014-02-18 ENCOUNTER — Encounter: Payer: Self-pay | Admitting: Cardiology

## 2014-02-18 VITALS — BP 110/70 | HR 58 | Ht 72.0 in | Wt 160.0 lb

## 2014-02-18 DIAGNOSIS — I2510 Atherosclerotic heart disease of native coronary artery without angina pectoris: Secondary | ICD-10-CM | POA: Insufficient documentation

## 2014-02-18 DIAGNOSIS — E785 Hyperlipidemia, unspecified: Secondary | ICD-10-CM

## 2014-02-18 NOTE — Progress Notes (Signed)
     HPI: 80 year old male previously followed by Dr. Rollene Fare for evaluation of coronary artery disease. Patient had a cardiac catheterization in 2003 that showed a 60% first diagonal, less than 50% circumflex and 40% left renal artery stenosis. Carotid Dopplers in 2008 were negative. Nuclear study in September 2013 showed an ejection fraction of 57%. No ischemia noted. Echocardiogram in August 2014 showed normal LV function. There was mild aortic stenosis and mild aortic insufficiency. Mild left atrial enlargement. Patient denies dyspnea, chest pain, palpitations or syncope. No claudication.  Current Outpatient Prescriptions  Medication Sig Dispense Refill  . aspirin EC 81 MG tablet Take 81 mg by mouth every morning.      Marland Kitchen KRILL OIL PO Take 1 tablet by mouth every morning.      . metoprolol tartrate (LOPRESSOR) 25 MG tablet Take 0.5 tablets (12.5 mg total) by mouth daily. Appointment 02/18/14 with Dr. Stanford Breed  15 tablet  0  . mirtazapine (REMERON) 15 MG tablet Take 3.75 mg by mouth at bedtime as needed.      . simvastatin (ZOCOR) 40 MG tablet TAKE ONE (1) TABLET BY MOUTH EVERY DAY  30 tablet  1   No current facility-administered medications for this visit.    Allergies  Allergen Reactions  . Procaine Hcl     REACTION: ANXIOUSNESS AND NERVOUSNESS    Past Medical History  Diagnosis Date  . Elevated cholesterol   . Hypertension   . CAD (coronary artery disease)   . Aortic stenosis     Past Surgical History  Procedure Laterality Date  . No previous surgery      History   Social History  . Marital Status: Married    Spouse Name: N/A    Number of Children: 3  . Years of Education: N/A   Occupational History  . Not on file.   Social History Main Topics  . Smoking status: Never Smoker   . Smokeless tobacco: Not on file  . Alcohol Use: No  . Drug Use: No  . Sexual Activity: Not on file   Other Topics Concern  . Not on file   Social History Narrative  . No  narrative on file    Family History  Problem Relation Age of Onset  . Heart disease      No family history    ROS: no fevers or chills, productive cough, hemoptysis, dysphasia, odynophagia, melena, hematochezia, dysuria, hematuria, rash, seizure activity, orthopnea, PND, pedal edema, claudication. Remaining systems are negative.  Physical Exam:   Blood pressure 110/70, pulse 58, height 6' (1.829 m), weight 160 lb (72.576 kg).  General:  Well developed/well nourished in NAD Skin warm/dry Patient not depressed No peripheral clubbing Back-normal HEENT-normal/normal eyelids Neck supple/normal carotid upstroke bilaterally; no bruits; no JVD; no thyromegaly chest - CTA/ normal expansion CV - RRR/normal S1 and S2; no murmurs, rubs or gallops;  PMI nondisplaced Abdomen -NT/ND, no HSM, no mass, + bowel sounds, no bruit 2+ femoral pulses, no bruits Ext-no edema, chords, 2+ DP Neuro-grossly nonfocal  ECG Sinus rhythm at a rate of 58. No ST changes.

## 2014-02-18 NOTE — Patient Instructions (Signed)
Your physician wants you to follow-up in: ONE YEAR WITH DR CRENSHAW You will receive a reminder letter in the mail two months in advance. If you don't receive a letter, please call our office to schedule the follow-up appointment.   Your physician recommends that you HAVE LAB WORK TODAY 

## 2014-02-18 NOTE — Assessment & Plan Note (Signed)
Continue aspirin and statin. Previous nuclear study showed no ischemia. No chest pain.

## 2014-02-18 NOTE — Assessment & Plan Note (Signed)
Continue statin. Check lipids and liver. 

## 2014-02-19 LAB — HEPATIC FUNCTION PANEL
ALBUMIN: 4.1 g/dL (ref 3.5–5.2)
ALT: 10 U/L (ref 0–53)
AST: 18 U/L (ref 0–37)
Alkaline Phosphatase: 54 U/L (ref 39–117)
Bilirubin, Direct: 0.2 mg/dL (ref 0.0–0.3)
Indirect Bilirubin: 0.6 mg/dL (ref 0.2–1.2)
TOTAL PROTEIN: 7.1 g/dL (ref 6.0–8.3)
Total Bilirubin: 0.8 mg/dL (ref 0.2–1.2)

## 2014-02-19 LAB — LIPID PANEL
CHOL/HDL RATIO: 4.2 ratio
CHOLESTEROL: 129 mg/dL (ref 0–200)
HDL: 31 mg/dL — AB (ref >39)
LDL CALC: 60 mg/dL (ref 0–99)
TRIGLYCERIDES: 188 mg/dL — AB (ref <150)
VLDL: 38 mg/dL (ref 0–40)

## 2014-02-20 ENCOUNTER — Encounter: Payer: Self-pay | Admitting: *Deleted

## 2014-03-24 ENCOUNTER — Other Ambulatory Visit: Payer: Self-pay

## 2014-03-24 MED ORDER — SIMVASTATIN 40 MG PO TABS: ORAL_TABLET | ORAL | Status: DC

## 2014-03-24 MED ORDER — METOPROLOL TARTRATE 25 MG PO TABS: 12.5000 mg | ORAL_TABLET | Freq: Every day | ORAL | Status: DC

## 2014-07-23 IMAGING — CR DG CHEST 2V
2 series · 2 of 2 positions shown · non-contrast
Comparison: 03/17/2009

CLINICAL DATA: Hemoptysis, cough.

CHEST - 2 VIEW

[view not recorded (1 of 2)]
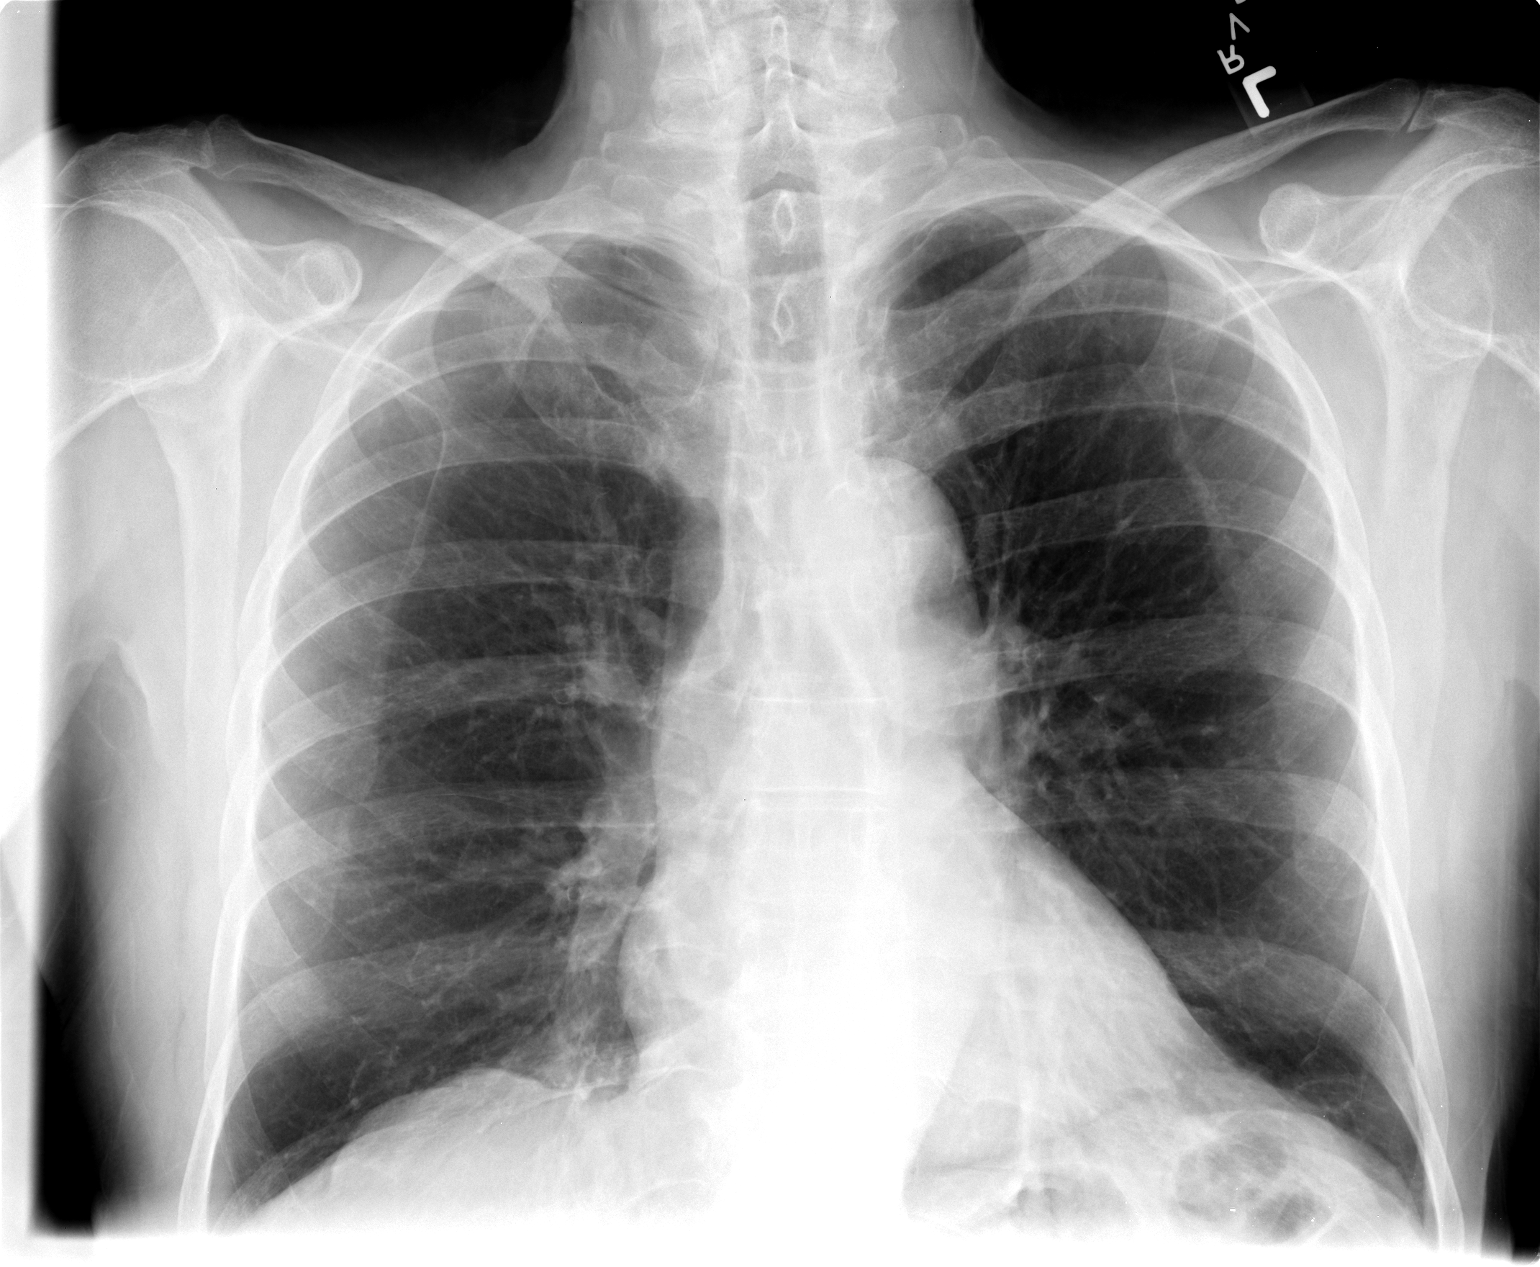

[view not recorded (2 of 2)]
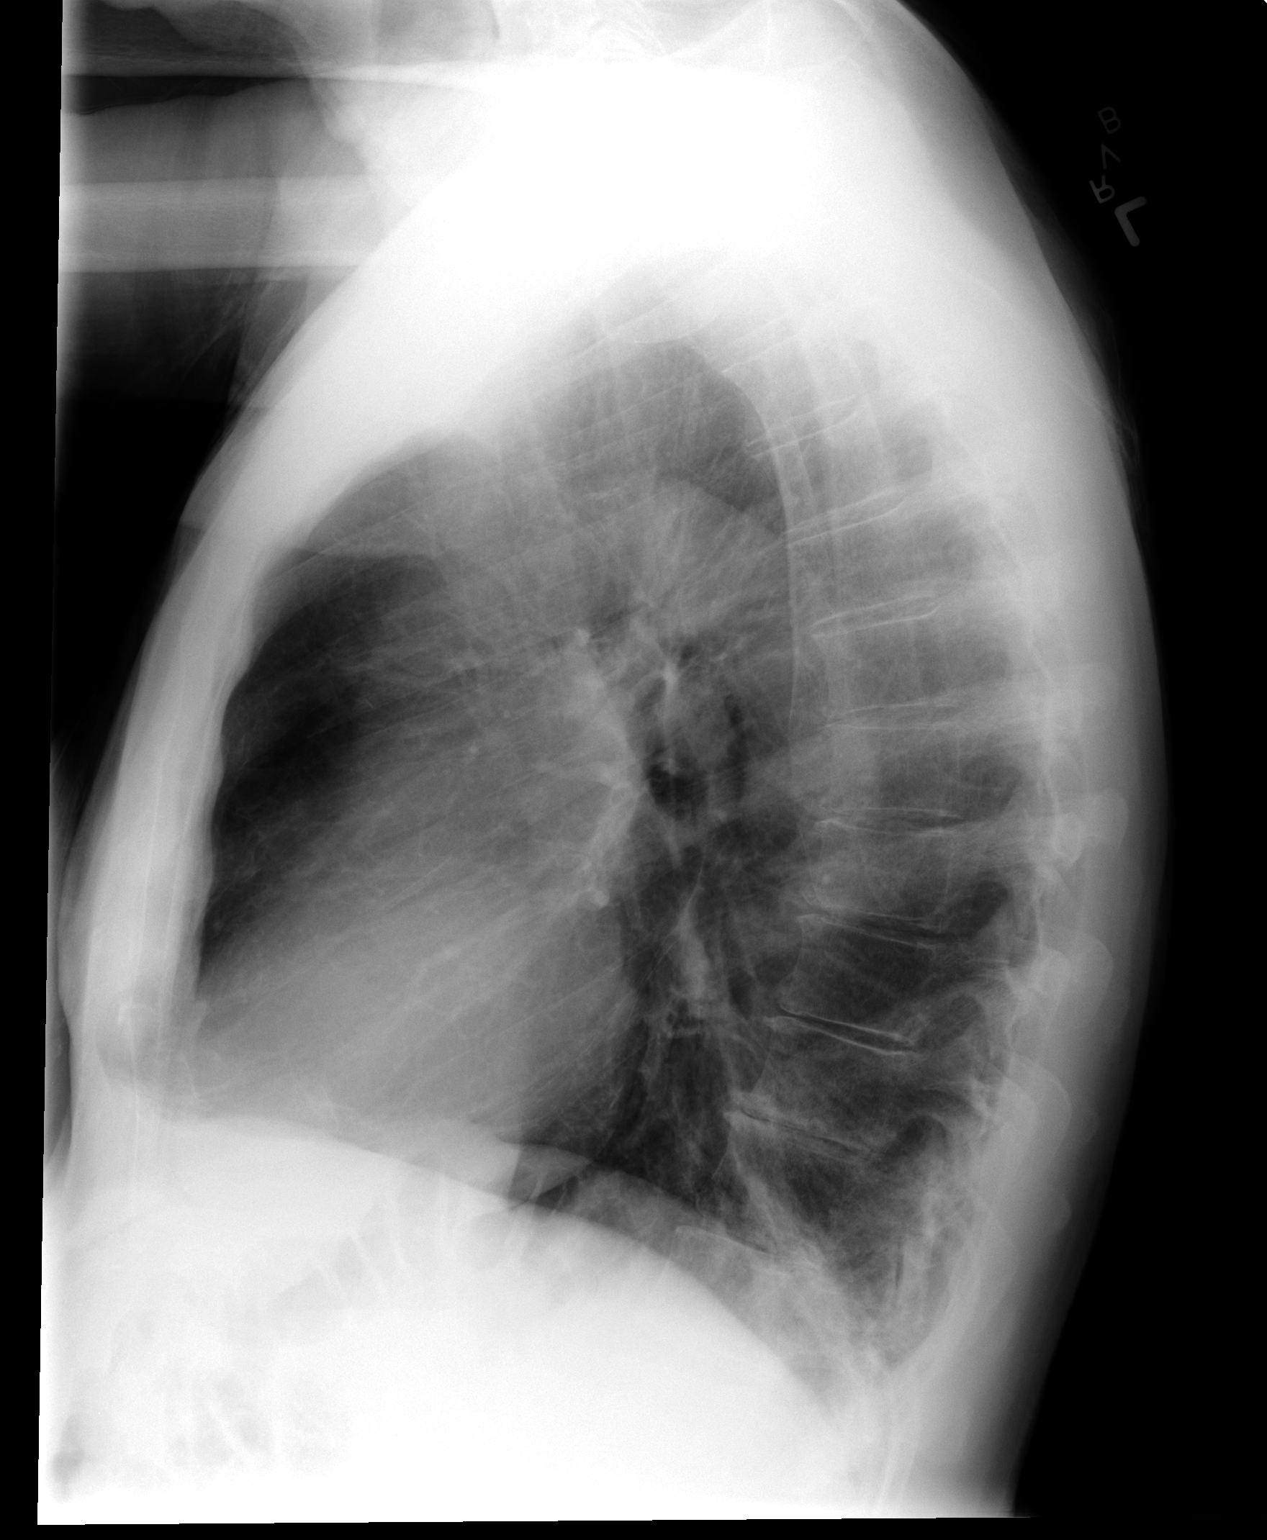

[2 of 2 positions shown; findings below may reference images not displayed]

FINDINGS: There is hyperinflation of the lungs compatible with
COPD.  Left basilar airspace opacity concerning for pneumonia.
Right lung is clear.  Heart is normal size.  No effusions or acute
bony abnormality.
IMPRESSION: COPD.

Findings suspicious for left lower lobe pneumonia.

## 2014-08-07 ENCOUNTER — Encounter (HOSPITAL_COMMUNITY): Payer: Self-pay

## 2014-08-07 ENCOUNTER — Emergency Department (HOSPITAL_COMMUNITY)
Admission: EM | Admit: 2014-08-07 | Discharge: 2014-08-07 | Disposition: A | Discharge status: Home / Self Care | Payer: Medicare Other | Attending: Emergency Medicine | Admitting: Emergency Medicine

## 2014-08-07 DIAGNOSIS — J45909 Unspecified asthma, uncomplicated: Secondary | ICD-10-CM | POA: Insufficient documentation

## 2014-08-07 DIAGNOSIS — Z7982 Long term (current) use of aspirin: Secondary | ICD-10-CM | POA: Insufficient documentation

## 2014-08-07 DIAGNOSIS — Z79899 Other long term (current) drug therapy: Secondary | ICD-10-CM | POA: Insufficient documentation

## 2014-08-07 DIAGNOSIS — Z87438 Personal history of other diseases of male genital organs: Secondary | ICD-10-CM | POA: Insufficient documentation

## 2014-08-07 DIAGNOSIS — E780 Pure hypercholesterolemia: Secondary | ICD-10-CM | POA: Insufficient documentation

## 2014-08-07 DIAGNOSIS — I10 Essential (primary) hypertension: Secondary | ICD-10-CM | POA: Diagnosis not present

## 2014-08-07 DIAGNOSIS — I2510 Atherosclerotic heart disease of native coronary artery without angina pectoris: Secondary | ICD-10-CM | POA: Insufficient documentation

## 2014-08-07 DIAGNOSIS — R310 Gross hematuria: Secondary | ICD-10-CM

## 2014-08-07 DIAGNOSIS — R319 Hematuria, unspecified: Secondary | ICD-10-CM | POA: Diagnosis not present

## 2014-08-07 LAB — URINALYSIS, ROUTINE W REFLEX MICROSCOPIC
BILIRUBIN URINE: NEGATIVE
Glucose, UA: NEGATIVE mg/dL
KETONES UR: NEGATIVE mg/dL
Leukocytes, UA: NEGATIVE
NITRITE: NEGATIVE
Protein, ur: 30 mg/dL — AB
Specific Gravity, Urine: 1.025 (ref 1.005–1.030)
UROBILINOGEN UA: 0.2 mg/dL (ref 0.0–1.0)
pH: 6.5 (ref 5.0–8.0)

## 2014-08-07 LAB — URINE MICROSCOPIC-ADD ON

## 2014-08-07 NOTE — ED Notes (Signed)
Bladder scan completed with result of 43-70ml urine in bladder.  Dr. Kathrynn Humble aware.

## 2014-08-07 NOTE — ED Provider Notes (Signed)
CSN: [id number]     Arrival date & time 08/07/14  0215 History   First MD Initiated Contact with Patient 08/07/14 0236     Chief Complaint  Patient presents with  . Hematuria     (Consider location/radiation/quality/duration/timing/severity/associated sxs/prior Treatment) HPI Comments: Pt comes in with gross hematuria. He is s/p TURP. He reports straining more than usual today, doing some heavy work. When he went to urinate tonight, he had tiny blood clots come out from the urine. He has no pain with urination, or frequent urination. Pt able to void with normal stream. His normal Urologist has retired, and he is concerned that his next appt with [facility] in [place] is too late (June 28).  Patient is a 81 y.o. male presenting with hematuria. The history is provided by the patient.  Hematuria    Past Medical History  Diagnosis Date  . Elevated cholesterol   . Hypertension   . CAD (coronary artery disease)   . Aortic stenosis   . Asthma   . BPH (benign prostatic hyperplasia)    Past Surgical History  Procedure Laterality Date  . No previous surgery     Family History  Problem Relation Age of Onset  . Heart disease      No family history   History  Substance Use Topics  . Smoking status: Never Smoker   . Smokeless tobacco: Not on file  . Alcohol Use: No    Review of Systems  Constitutional: Negative for diaphoresis and unexpected weight change.  Gastrointestinal: Negative for nausea.  Genitourinary: Positive for hematuria. Negative for dysuria, frequency and flank pain.  Musculoskeletal: Negative for back pain.  Allergic/Immunologic: Negative for immunocompromised state.      Allergies  Procaine hcl  Home Medications   Prior to Admission medications   Medication Sig Start Date End Date Taking? Authorizing Provider  aspirin EC 81 MG tablet Take 81 mg by mouth every morning.   Yes Historical Provider, MD  KRILL OIL PO Take 1 tablet by mouth every  morning.   Yes Historical Provider, MD  metoprolol tartrate (LOPRESSOR) 25 MG tablet Take 0.5 tablets (12.5 mg total) by mouth daily. Appointment 02/18/14 with Dr. Stanford Breed 03/24/14  Yes Lelon Perla, MD  mirtazapine (REMERON) 15 MG tablet Take 3.75 mg by mouth at bedtime as needed.   Yes Historical Provider, MD  simvastatin (ZOCOR) 40 MG tablet TAKE ONE (1) TABLET BY MOUTH EVERY DAY 03/24/14  Yes Lelon Perla, MD   BP 193/83 mmHg  Pulse 60  Temp(Src) 97.5 F (36.4 C) (Oral)  Resp 20  Ht 6' (1.829 m)  Wt 160 lb (72.576 kg)  BMI 21.70 kg/m2  SpO2 98% Physical Exam  Constitutional: He appears well-developed.  HENT:  Head: Atraumatic.  Eyes: Conjunctivae are normal.  Neck: Neck supple.  Cardiovascular: Normal rate.   Pulmonary/Chest: Effort normal.  Abdominal: Soft. He exhibits no distension. There is no tenderness.  Neurological: He is alert.  Skin: Skin is warm.  Nursing note and vitals reviewed.   ED Course  Procedures (including critical care time) Labs Review Labs Reviewed  URINALYSIS, ROUTINE W REFLEX MICROSCOPIC - Abnormal; Notable for the following:    Color, Urine AMBER (*)    APPearance HAZY (*)    Hgb urine dipstick LARGE (*)    Protein, ur 30 (*)    All other components within normal limits  URINE MICROSCOPIC-ADD ON - Abnormal; Notable for the following:    Bacteria, UA FEW (*)  All other components within normal limits    Imaging Review No results found.   EKG Interpretation None      MDM   Final diagnoses:  Gross hematuria    Pt comes in with gross hematuria. The stream is normal. The post void residual on bladder scan is fine. UA shows no infection.  Will message Dr. Diona Fanti to see if the patient can be seen sooner.  Varney Biles, MD 08/07/14 [phone]

## 2014-08-07 NOTE — ED Notes (Signed)
Pt states he started passing bright red blood and some clots with urination approx 2 hours ago, having mild discomfort as well

## 2014-08-07 NOTE — Discharge Instructions (Signed)
The urine is NOT showing any infection currently. Please see Dr. Diona Fanti as soon as possible. We have emailed Dr. Diona Fanti about you as well, so you may get a call from them.  Return to the ER if you have severe pain, or if you are unable to urinate. Hematuria Hematuria is blood in your urine. It can be caused by a bladder infection, kidney infection, prostate infection, kidney stone, or cancer of your urinary tract. Infections can usually be treated with medicine, and a kidney stone usually will pass through your urine. If neither of these is the cause of your hematuria, further workup to find out the reason may be needed. It is very important that you tell your health care provider about any blood you see in your urine, even if the blood stops without treatment or happens without causing pain. Blood in your urine that happens and then stops and then happens again can be a symptom of a very serious condition. Also, pain is not a symptom in the initial stages of many urinary cancers. HOME CARE INSTRUCTIONS   Drink lots of fluid, 3-4 quarts a day. If you have been diagnosed with an infection, cranberry juice is especially recommended, in addition to large amounts of water.  Avoid caffeine, tea, and carbonated beverages because they tend to irritate the bladder.  Avoid alcohol because it may irritate the prostate.  Take all medicines as directed by your health care provider.  If you were prescribed an antibiotic medicine, finish it all even if you start to feel better.  If you have been diagnosed with a kidney stone, follow your health care provider's instructions regarding straining your urine to catch the stone.  Empty your bladder often. Avoid holding urine for long periods of time.  After a bowel movement, women should cleanse front to back. Use each tissue only once.  Empty your bladder before and after sexual intercourse if you are a male. SEEK MEDICAL CARE IF:  You develop back  pain.  You have a fever.  You have a feeling of sickness in your stomach (nausea) or vomiting.  Your symptoms are not better in 3 days. Return sooner if you are getting worse. SEEK IMMEDIATE MEDICAL CARE IF:   You develop severe vomiting and are unable to keep the medicine down.  You develop severe back or abdominal pain despite taking your medicines.  You begin passing a large amount of blood or clots in your urine.  You feel extremely weak or faint, or you pass out. MAKE SURE YOU:   Understand these instructions.  Will watch your condition.  Will get help right away if you are not doing well or get worse. Document Released: 04/10/2005 Document Revised: 08/25/2013 Document Reviewed: 12/09/2012 [facility] Patient Information 2015 [place], [place]. This information is not intended to replace advice given to you by your health care provider. Make sure you discuss any questions you have with your health care provider.

## 2014-08-09 LAB — URINE CULTURE
COLONY COUNT: NO GROWTH
CULTURE: NO GROWTH

## 2014-08-13 ENCOUNTER — Encounter: Payer: Self-pay | Admitting: *Deleted

## 2014-10-20 ENCOUNTER — Ambulatory Visit (INDEPENDENT_AMBULATORY_CARE_PROVIDER_SITE_OTHER): Payer: Medicare Other | Admitting: Urology

## 2014-10-20 ENCOUNTER — Other Ambulatory Visit: Payer: Self-pay | Admitting: Cardiology

## 2014-10-20 DIAGNOSIS — R312 Other microscopic hematuria: Secondary | ICD-10-CM | POA: Diagnosis not present

## 2014-10-20 DIAGNOSIS — N529 Male erectile dysfunction, unspecified: Secondary | ICD-10-CM

## 2014-10-20 DIAGNOSIS — N401 Enlarged prostate with lower urinary tract symptoms: Secondary | ICD-10-CM

## 2014-10-20 DIAGNOSIS — R972 Elevated prostate specific antigen [PSA]: Secondary | ICD-10-CM | POA: Diagnosis not present

## 2014-10-20 NOTE — Telephone Encounter (Signed)
Rx(s) sent to pharmacy electronically.  

## 2015-03-02 ENCOUNTER — Ambulatory Visit (INDEPENDENT_AMBULATORY_CARE_PROVIDER_SITE_OTHER): Payer: Medicare Other | Admitting: Cardiology

## 2015-03-02 ENCOUNTER — Encounter: Payer: Self-pay | Admitting: Cardiology

## 2015-03-02 ENCOUNTER — Encounter: Payer: Self-pay | Admitting: *Deleted

## 2015-03-02 VITALS — BP 124/68 | HR 61 | Ht 72.0 in | Wt 151.0 lb

## 2015-03-02 DIAGNOSIS — I2510 Atherosclerotic heart disease of native coronary artery without angina pectoris: Secondary | ICD-10-CM

## 2015-03-02 DIAGNOSIS — E785 Hyperlipidemia, unspecified: Secondary | ICD-10-CM

## 2015-03-02 MED ORDER — METOPROLOL TARTRATE 25 MG PO TABS: 12.5000 mg | ORAL_TABLET | Freq: Two times a day (BID) | ORAL | Status: DC

## 2015-03-02 MED ORDER — PROAIR HFA 108 (90 BASE) MCG/ACT IN AERS: 1.0000 | INHALATION_SPRAY | Freq: Four times a day (QID) | RESPIRATORY_TRACT | Status: DC | PRN

## 2015-03-02 NOTE — Patient Instructions (Signed)
Medication Instructions:  Increase Metoprolol to 12.5 mg TWO times daily ( TAKE 1/2 PILL- 2 TIMES PER DAY)  Labwork: WE WILL SEND FOR A COPY OF YOUR LAB WORK FROM YOUR PCP  Testing/Procedures: NONE  Follow-Up: Your physician wants you to follow-up in: [place] DR. BRANCH. You will receive a reminder letter in the mail two months in advance. If you don't receive a letter, please call our office to schedule the follow-up appointment.\  Any Other Special Instructions Will Be Listed Below (If Applicable).     If you need a refill on your cardiac medications before your next appointment, please call your pharmacy.  Thanks for choosing [facility]!!!

## 2015-03-02 NOTE — Progress Notes (Signed)
Patient ID: DERRIEN ANSCHUTZ, male   DOB: April 27, 1928, 81 y.o.   MRN: [id number]     Clinical Summary Mr. Sonnenberg is a 81 y.o.male last seen by Dr Stanford Breed, this is our first visit together. He is seen for the following medical problem.s  1. CAD - moderate nonobstructive disease by cath in 2003.  - nuclear stress 2013 without ischemia - echo 2014 with LVEF 55-60%, no WMAs, normal diasotlic function, mild AS and mild AI.   - denies any chest pain. Notes SOB mainly with temperature changes, mainly cold weather. Instant relief with prn albuterol. He is trying different longer acting inhalers with his pcp.  - exercising less since his wife had a stroke a few months ago. Prior to that used to walk 2 miles daily.   2. 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 Wife Ezrael Sam who is also a patient of mine had recent stroke.  Past Medical History  Diagnosis Date  . Elevated cholesterol   . Hypertension   . Aortic stenosis   . Asthma   . BPH (benign prostatic hyperplasia)   . CAD (coronary artery disease)     catheterization with 60% DX1, less than 50% circumflex, 40% LRAS  . Murmur 12/24/12  . GERD (gastroesophageal reflux disease)      Allergies  Allergen Reactions  . Procaine Hcl     REACTION: ANXIOUSNESS AND NERVOUSNESS     Current Outpatient Prescriptions  Medication Sig Dispense Refill  . aspirin EC 81 MG tablet Take 81 mg by mouth every morning.    Marland Kitchen KRILL OIL PO Take 1 tablet by mouth every morning.    . metoprolol tartrate (LOPRESSOR) 25 MG tablet TAKE ONE-HALF TABLET DAILY 30 tablet 6  . mirtazapine (REMERON) 15 MG tablet Take 3.75 mg by mouth at bedtime as needed.    . simvastatin (ZOCOR) 40 MG tablet TAKE ONE (1) TABLET EACH DAY 30 tablet 6   No current facility-administered medications for this visit.     Past Surgical History  Procedure Laterality Date  . No previous surgery    . Cardiac catheterization Left      Allergies    Allergen Reactions  . Procaine Hcl     REACTION: ANXIOUSNESS AND NERVOUSNESS      Family History  Problem Relation Age of Onset  . Heart disease      No family history     Social History Mr. Mckesson reports that he has never smoked. He does not have any smokeless tobacco history on file. Mr. Epple reports that he does not drink alcohol.   Review of Systems CONSTITUTIONAL: No weight loss, fever, chills, weakness or fatigue.  HEENT: Eyes: No visual loss, blurred vision, double vision or yellow sclerae.No hearing loss, sneezing, congestion, runny nose or sore throat.  SKIN: No rash or itching.  CARDIOVASCULAR: per HPI RESPIRATORY: No shortness of breath, cough or sputum.  GASTROINTESTINAL: No anorexia, nausea, vomiting or diarrhea. No abdominal pain or blood.  GENITOURINARY: No burning on urination, no polyuria NEUROLOGICAL: No headache, dizziness, syncope, paralysis, ataxia, numbness or tingling in the extremities. No change in bowel or bladder control.  MUSCULOSKELETAL: No muscle, back pain, joint pain or stiffness.  LYMPHATICS: No enlarged nodes. No history of splenectomy.  PSYCHIATRIC: No history of depression or anxiety.  ENDOCRINOLOGIC: No reports of sweating, cold or heat intolerance. No polyuria or polydipsia.  Marland Kitchen   Physical Examination Filed Vitals:   03/02/15 0912  BP: 124/68  Pulse: 61   Filed Vitals:   03/02/15 0912  Height: 6' (1.829 m)  Weight: 151 lb (68.493 kg)    Gen: resting comfortably, no acute distress HEENT: no scleral icterus, pupils equal round and reactive, no palptable cervical adenopathy,  CV: RRR, no m/r/g, no jvd Resp: Clear to auscultation bilaterally GI: abdomen is soft, non-tender, non-distended, normal bowel sounds, no hepatosplenomegaly MSK: extremities are warm, no edema.  Skin: warm, no rash Neuro:  no focal deficits Psych: appropriate affect     Assessment and Plan  1. CAD - moderate nonobstructive disease by cath in  2003 - no current chest pain. SOB/DOE responds to bronchodilators - continue risk factor modificatoin   2. Hyperlipidmeia - request recent panel from pcp - continue statin  F/u 6 months      Arnoldo Lenis, M.D.

## 2015-06-04 ENCOUNTER — Encounter: Payer: Self-pay | Admitting: Family Medicine

## 2015-06-18 ENCOUNTER — Other Ambulatory Visit: Payer: Self-pay | Admitting: Cardiology

## 2015-06-21 ENCOUNTER — Other Ambulatory Visit: Payer: Self-pay | Admitting: Cardiology

## 2015-06-21 NOTE — Telephone Encounter (Signed)
REFILL 

## 2015-07-09 ENCOUNTER — Other Ambulatory Visit (HOSPITAL_COMMUNITY): Payer: Self-pay | Admitting: Family Medicine

## 2015-07-09 ENCOUNTER — Ambulatory Visit (HOSPITAL_COMMUNITY)
Admission: RE | Admit: 2015-07-09 | Discharge: 2015-07-09 | Disposition: A | Discharge status: Home / Self Care | Payer: Medicare Other | Source: Ambulatory Visit | Attending: Family Medicine | Admitting: Family Medicine

## 2015-07-09 DIAGNOSIS — J069 Acute upper respiratory infection, unspecified: Secondary | ICD-10-CM

## 2015-10-29 ENCOUNTER — Ambulatory Visit: Payer: Medicare Other | Admitting: Urology

## 2015-12-07 ENCOUNTER — Encounter: Payer: Self-pay | Admitting: Allergy & Immunology

## 2015-12-07 ENCOUNTER — Ambulatory Visit (INDEPENDENT_AMBULATORY_CARE_PROVIDER_SITE_OTHER): Payer: Medicare Other | Admitting: Allergy & Immunology

## 2015-12-07 VITALS — BP 106/62 | HR 60 | Temp 97.8°F | Resp 14 | Ht 70.87 in | Wt 155.0 lb

## 2015-12-07 DIAGNOSIS — R0602 Shortness of breath: Secondary | ICD-10-CM

## 2015-12-07 DIAGNOSIS — J4551 Severe persistent asthma with (acute) exacerbation: Secondary | ICD-10-CM

## 2015-12-07 DIAGNOSIS — J310 Chronic rhinitis: Secondary | ICD-10-CM | POA: Diagnosis not present

## 2015-12-07 DIAGNOSIS — J4550 Severe persistent asthma, uncomplicated: Secondary | ICD-10-CM | POA: Insufficient documentation

## 2015-12-07 MED ORDER — BUDESONIDE-FORMOTEROL FUMARATE 160-4.5 MCG/ACT IN AERO: INHALATION_SPRAY | RESPIRATORY_TRACT | 5 refills | Status: DC

## 2015-12-07 MED ORDER — AEROCHAMBER PLUS MISC: 2 refills | Status: AC

## 2015-12-07 MED ORDER — FLUTICASONE PROPIONATE 50 MCG/ACT NA SUSP: 2.0000 | Freq: Every day | NASAL | 5 refills | Status: DC

## 2015-12-07 NOTE — Patient Instructions (Signed)
1. Severe persistent asthma, with acute exacerbation - Lung function testing showed evidence of asthma and your numbers improved with use of the nebulizer today. - Start prednisone 60mg  daily and continue for five days. - We are starting a controller medication today: Symbicort 160/4.5 two puffs in the morning and two puffs at night. You need to use a spacer every time you use this medication. - Use albuterol (ProAir) 4 puffs every 4-6 hours for the next couple of days and then decrease as tolerated.  2. Chronic rhinitis - Your nose looks enlarged with clear mucous today, which could be making your breathing worse.  - Start Flonase two sprays per nostril daily. - We will send blood work to look for evidence of allergies. We will call you in one week with the results.   3. SOB (shortness of breath) - This is likely related to a recurrence of your asthma. - It is important that you follow up with Cardiology, however, to make sure that your heart is not involved.  4. Return to clinic in one month.  It was a pleasure to meet you today!

## 2015-12-07 NOTE — Progress Notes (Signed)
NEW PATIENT  Date of Service/Encounter:  12/07/15   Assessment:   Severe persistent asthma, with acute exacerbation - Plan: Spirometry with Graph  Chronic rhinitis - Plan: Allergen Zone 3  SOB (shortness of breath)   Asthma Reportables:  Severity: : severe persistent  Risk: high due to age and history of cardiac abnormalities Control: very poorly controlled  Seasonal Influenza Vaccine: yes   Plan/Recommendations:    1. Severe persistent asthma, with acute exacerbation - Lung function testing showed evidence of asthma and your numbers improved with use of the nebulizer today. - Spirometry and improvement with nebulizer treatment makes the diagnosis of asthma very likely. - Start prednisone 60mg  daily and continue for five days. - We will start Symbicort 160/4.5 two puffs in the morning and two puffs at night.  - Spacer mask teaching was provided.  - Use albuterol (ProAir) 4 puffs every 4-6 hours for the next couple of days and then decrease as tolerated.  2. Chronic rhinitis - Physical exam consistent with uncontrolled rhinitis. - Start Flonase two sprays per nostril daily. - Beta blockers are a contraindication to skin testing today. - Lung function would lead to deferment of skin testing as well.  - Therefore, we will send blood work to look for evidence of allergies.  3. SOB (shortness of breath) - This is likely related to a recurrence of asthma. - It is important that you follow up with Cardiology, however, to make sure that your heart is not involved.  4. Return to clinic in one month.    Subjective:   ZYMIRE MIKES is a 82 y.o. male presenting today for evaluation of  Chief Complaint  Patient presents with  . Cough    started beginning of summer used Proair HFA 69mcg  . Wheezing    Started beginning of summer  .  ROYSE SEWER has a history of the following: Patient Active Problem List   Diagnosis Date Noted  . Severe persistent asthma 12/07/2015   . Chronic rhinitis 12/07/2015  . CAD (coronary artery disease) 02/18/2014  . Hyperlipidemia 02/18/2014  . HEMORRHOIDS 09/22/2008  . GERD 09/22/2008  . ELEVATED PROSTATE SPECIFIC ANTIGEN 09/22/2008  . HEMATOCHEZIA, HX OF 09/22/2008    History obtained from: chart review and patient.  Tawny Asal was referred by Purvis Kilts, MD.     Grayland is a 82 y.o. male presenting for shortness of breath. He was diagnosed with asthma and allergies when he was much younger. However his symptoms have been well controlled since then but worsened over the last few years. He does have ProAir which he does use. He started noticing the SOB over the whole summer. He is using it approximately 2-3 times per day. He is waking up wheezing and requiring the albuterol. He is unsure what is triggering the symptoms. Normally, he would last an entire year with one albuterol. Now he is going through a canister every month. He does feel like it helps when he uses it. His symptoms include more chest tightness and decreased physical endurance. He walked two miles every morning for the past 50 years. He has now had to decrease his distance even more. He would really like to figure out what he is allergic to. It is difficult to figure out where his breathing is worse - indoors versus outdoors. Otilio has never discussed this with Dr. Armandina Gemma (his PCP). He is scheduled to have an appointment in a couple of days. His is unsure when  his next cardiologist appointment.   Delno did have allergy testing in the 1960s and had allergy shots for two years. Since that time he has done very well until this year. He has not ocular symptoms or rhinorrhea or nasal congestion. He does not feel much of anything coming down the back of his throat. He will have spit up of mucous that is clear colored. He has not tried taking any allergic medications. He does not remember needing prednisone or ED visits for his breathing.    Hersey does have a  history of coronary artery disease and is followed by Dr. Harl Bowie in Cardiology. CAD was diagnosed via cath in 2003. He had a nuclear stress test in 2013 that showed no ischemia. He has had an echocardiogram in 2014 which showed an ejection fraction of 55-60%. He had normal diastolic function as well as mild aortic stenosis and mild aortic insufficiency. During the last visit, he was having some shortness of breath and dyspnea on exertion which responded to bronchodilators. Dr. Harl Bowie did not feel that these symptoms are related to his heart. His last visit with Dr. Harl Bowie was in November 2016 and he is supposed to follow up every 6 months, per the last visit note.  He is on metoprolol for presumed hypertension and simvastatin for hypercholesterolemia. Otherwise he is healthy. Vaccinations are up to date, including flu shots. Otherwise, there is no history of other atopic diseases, including drug allergies, food allergies, stinging insect allergies, or urticaria. There is no significant infectious history.    Past Medical History: Patient Active Problem List   Diagnosis Date Noted  . Severe persistent asthma 12/07/2015  . Chronic rhinitis 12/07/2015  . CAD (coronary artery disease) 02/18/2014  . Hyperlipidemia 02/18/2014  . HEMORRHOIDS 09/22/2008  . GERD 09/22/2008  . ELEVATED PROSTATE SPECIFIC ANTIGEN 09/22/2008  . HEMATOCHEZIA, HX OF 09/22/2008    Medication List:    Medication List       Accurate as of 12/07/15 12:04 PM. Always use your most recent med list.          AEROCHAMBER PLUS inhaler Use as instructed   aspirin EC 81 MG tablet Take 81 mg by mouth every morning.   budesonide-formoterol 160-4.5 MCG/ACT inhaler Commonly known as:  SYMBICORT Use 2 puffs twice daily to prevent cough or wheeze. Rinse, gargle and spit after use.   fluticasone 50 MCG/ACT nasal spray Commonly known as:  FLONASE Place 2 sprays into both nostrils daily.   KRILL OIL PO Take 1 tablet by mouth  every morning.   metoprolol tartrate 25 MG tablet Commonly known as:  LOPRESSOR Take 0.5 tablets (12.5 mg total) by mouth 2 (two) times daily.   mirtazapine 15 MG tablet Commonly known as:  REMERON Take 3.75 mg by mouth at bedtime as needed.   PROAIR HFA 108 (90 Base) MCG/ACT inhaler Generic drug:  albuterol Inhale 1-2 puffs into the lungs every 6 (six) hours as needed for wheezing or shortness of breath.   simvastatin 40 MG tablet Commonly known as:  ZOCOR TAKE ONE (1) TABLET EACH DAY       Birth History: non-contributory  Developmental History: No concerns   Past Surgical History: Past Surgical History:  Procedure Laterality Date  . CARDIAC CATHETERIZATION Left   . No previous surgery       Family History: Family History  Problem Relation Age of Onset  . Heart disease      No family history  . Allergic rhinitis Neg Hx   .  Asthma Neg Hx   . Eczema Neg Hx   . Immunodeficiency Neg Hx   . Urticaria Neg Hx   . Angioedema Neg Hx      Social History: Gustaf lives in a 80yo home with electric heating and central air conditioning. There is hardwood throughout and no dust mite covers. There are no animals in the home. He has never been a smoker. Mr. Gavins is now retired. 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 He lives at home with his wife.    Review of Systems: a 14-point review of systems is pertinent for what is mentioned in HPI.  Otherwise, all other systems were negative. Constitutional: negative other than that listed in the HPI Eyes: negative other than that listed in the HPI Ears, nose, mouth, throat, and face: negative other than that listed in the HPI Respiratory: negative other than that listed in the HPI Cardiovascular: negative other than that listed in the HPI Gastrointestinal: negative other than that listed in the HPI Genitourinary: negative other than that listed in the HPI Integument: negative other than that listed in the HPI Hematologic: negative  other than that listed in the HPI Musculoskeletal: negative other than that listed in the HPI Neurological: negative other than that listed in the HPI Allergy/Immunologic: negative other than that listed in the HPI    Objective:   Blood pressure 106/62, pulse 60, temperature 97.8 F (36.6 C), temperature source Oral, resp. rate 14, height 5' 10.87" (1.8 m), weight 155 lb (70.3 kg), SpO2 93 %. Body mass index is 21.7 kg/m.  Repeat SpO2 97% following DuoNeb nebulizer  Physical Exam:  General: Alert, interactive, in no acute distress. HEENT: TMs pearly gray, turbinates markedly edematous with clear discharge, post-pharynx erythematous with cobblestoning appreciated Neck: Supple without lymphadenopathy. Lungs: Decreased breath sounds with expiratory wheezing bilaterally. Increased work of breathing. Speaking in complete sentences but taking large breaths between sentences.  CV: Normal S1, S2 without murmurs. Capillary refill <2 seconds. Abdomen: Nondistended, nontender. Skin:Warm and dry, without lesions or rashes. Extremities: No clubbing, cyanosis or edema. Neuro: Grossly intact. No focal deficits noted.   Diagnostic studies:  Spirometry: results abnormal (FEV1: 1.55/56%, FVC: 2.42/64%, FEV1/FVC: 63%).    Spirometry consistent with mixed obstructive and restrictive disease. DuoNeb nebulizer treatment was administered with marked improvement of FVC and FEV1. The FVC increased 720 mL (29%) and the FEV1 increased 380 mL (24%). FEV1/FVC ratio remained at 61%. There was also a 10% increase in FEF 25-75%. Although the flow volume loop did not normalize, the values markedly improved.   Allergy Studies: deferred due to metoprolol therapy   Salvatore Marvel, MD [facility] [place]

## 2015-12-08 LAB — CP584 ZONE 3
Allergen, Black Locust, Acacia9: <0.1 kU/L
Allergen, Cedar tree, t12: <0.1 kU/L
Allergen, Comm Silver Birch, t9: <0.1 kU/L
Allergen, D pternoyssinus,d7: 1.99 kU/L — ABNORMAL HIGH
Allergen, Mucor Racemosus, M4: <0.1 kU/L
Allergen, Oak,t7: <0.1 kU/L
Allergen, P. notatum, m1: <0.1 kU/L
Bermuda Grass: <0.1 kU/L
Cat Dander: <0.1 kU/L
Cockroach: <0.1 kU/L
Common Ragweed: <0.1 kU/L
D. farinae: 2.46 kU/L — ABNORMAL HIGH
Dog Dander: <0.1 kU/L
Elm IgE: <0.1 kU/L
Johnson Grass: <0.1 kU/L
Meadow Grass: <0.1 kU/L
Nettle: <0.1 kU/L

## 2015-12-21 ENCOUNTER — Ambulatory Visit (INDEPENDENT_AMBULATORY_CARE_PROVIDER_SITE_OTHER): Payer: Medicare Other | Admitting: Urology

## 2015-12-21 DIAGNOSIS — N400 Enlarged prostate without lower urinary tract symptoms: Secondary | ICD-10-CM | POA: Diagnosis not present

## 2015-12-21 DIAGNOSIS — R972 Elevated prostate specific antigen [PSA]: Secondary | ICD-10-CM | POA: Diagnosis not present

## 2016-01-04 ENCOUNTER — Encounter: Payer: Self-pay | Admitting: Allergy & Immunology

## 2016-01-04 ENCOUNTER — Ambulatory Visit (INDEPENDENT_AMBULATORY_CARE_PROVIDER_SITE_OTHER): Payer: Medicare Other | Admitting: Allergy & Immunology

## 2016-01-04 VITALS — BP 110/65 | HR 60 | Temp 97.8°F | Resp 16

## 2016-01-04 DIAGNOSIS — J4550 Severe persistent asthma, uncomplicated: Secondary | ICD-10-CM | POA: Diagnosis not present

## 2016-01-04 DIAGNOSIS — J310 Chronic rhinitis: Secondary | ICD-10-CM

## 2016-01-04 NOTE — Patient Instructions (Addendum)
1. Severe persistent asthma, uncomplicated - Continue with Symbicort two puffs twice daily. - Try using the spacer to help the medication get deeper into the lungs. - Continue with ProAir 4 puffs every 4-6 hours as needed.   2. Chronic rhinitis - Testing was positive to dust mites. - Get dust mite covers to limit your exposure. - Use Flonase 1-2 sprays per nostril daily to help control your nose symptoms.  3. Return in about 6 months (around 07/03/2016).  Please inform us of any Emergency Department visits, hospitalizations, or changes in symptoms. Call us before going to the ED for breathing or allergy symptoms since we might be able to fit you in for a sick visit. Feel free to contact us anytime with any questions, problems, or concerns.  It was a pleasure to see you again today!   Control of House Dust Mite Allergen  House dust mites play a major role in allergic asthma and rhinitis.  They occur in environments with high humidity wherever human skin, the food for dust mites is found. High levels have been detected in dust obtained from mattresses, pillows, carpets, upholstered furniture, bed covers, clothes and soft toys.  The principal allergen of the house dust mite is found in its feces.  A gram of dust may contain 1,000 mites and 250,000 fecal particles.  Mite antigen is easily measured in the air during house cleaning activities.    1. Encase mattresses, including the box spring, and pillow, in an air tight cover.  Seal the zipper end of the encased mattresses with wide adhesive tape. 2. Wash the bedding in water of 130 degrees Farenheit weekly.  Avoid cotton comforters/quilts and flannel bedding: the most ideal bed covering is the dacron comforter. 3. Remove all upholstered furniture from the bedroom. 4. Remove carpets, carpet padding, rugs, and non-washable window drapes from the bedroom.  Wash drapes weekly or use plastic window coverings. 5. Remove all non-washable stuffed toys  from the bedroom.  Wash stuffed toys weekly. 6. Have the room cleaned frequently with a vacuum cleaner and a damp dust-mop.  The patient should not be in a room which is being cleaned and should wait 1 hour after cleaning before going into the room. 7. Close and seal all heating outlets in the bedroom.  Otherwise, the room will become filled with dust-laden air.  An electric heater can be used to heat the room. 8. Reduce indoor humidity to less than 50%.  Do not use a humidifier.  1

## 2016-01-04 NOTE — Progress Notes (Signed)
FOLLOW UP  Date of Service/Encounter:  01/04/16   Assessment:   Severe persistent asthma, uncomplicated  Chronic rhinitis   Asthma Reportables:  Severity: : severe persistent  Risk: low Control: well controlled  Seasonal Influenza Vaccine: no but encouraged   The CDC recommends patients with persistent asthma between the ages of 19-64 years receive PPSV-23 vaccination, if this patient qualifies, has he/she received 1 dose of PPSV-23 yet? Yes    Plan/Recommendations:    1. Severe persistent asthma, uncomplicated - Continue with Symbicort two puffs twice daily. - Try using the spacer to help the medication get deeper into the lungs. - Continue with ProAir 4 puffs every 4-6 hours as needed.  - We were able to provide a spacer today. - I also taught him the 3 finger technique and case spacer was not available. - Mr. Kammeyer should get his flu shot this year.  2. Chronic rhinitis - Testing was positive to dust mites. - Get dust mite covers to limit your exposure. - Additional avoidance measures discussed. - Use Flonase 1-2 sprays per nostril daily to help control your nose symptoms.  3. Return in about 6 months (around 07/03/2016).   Subjective:   Ryan Hansen is a 82 y.o. male presenting today for follow up of  Chief Complaint  Patient presents with  . Asthma    Doing better.  No recent flares since last OV.  Not using spacer.  Ryan Hansen has a history of the following: Patient Active Problem List   Diagnosis Date Noted  . Severe persistent asthma 12/07/2015  . Chronic rhinitis 12/07/2015  . CAD (coronary artery disease) 02/18/2014  . Hyperlipidemia 02/18/2014  . HEMORRHOIDS 09/22/2008  . GERD 09/22/2008  . ELEVATED PROSTATE SPECIFIC ANTIGEN 09/22/2008  . HEMATOCHEZIA, HX OF 09/22/2008    History obtained from: chart review and patient.  Ryan Hansen was referred by Purvis Kilts, MD.     Ryan Hansen is a 82 y.o. male presenting for a follow  up visit for asthma and allergies. We last saw him in August around 1 month ago. At that time, he looked fairly sick. He had lying testing that was notable for marked improvement in his spirometry following albuterol administration. We started him on prednisone 60 mg daily which she continued for 5 days. He also started Symbicort 160/4.52 puffs twice a day as well as Pro Air 4 puffs 6 hours. We do not to skin testing since his breathing was less than ideal. Plan serum specific IgE testing showed sensitization to dust mites. He was started on Flonase 2 sprays per nostril daily.  Since that visit, he has done very well. He has been compliant with his Symbicort. We did not have a spacer to provide at the last visit, therefore he has not been using one with his Symbicort. At this time, he is just administering the Symbicort by putting his mouth around the inhaler hef. He does feel as if this works. He has not had used [employer] since we saw him. He has felt his allergies are controlled with when necessary use of Flonase. He estimates that he forgets to use it more often than not. He has not gotten dust mite covers.   Otherwise, there have been no changes to the past medical history, surgical history, family history, or social history.     Review of Systems: a 14-point review of systems is pertinent for what is mentioned in HPI.  Otherwise, all other  systems were negative. Constitutional: negative other than that listed in the HPI Eyes: negative other than that listed in the HPI Ears, nose, mouth, throat, and face: negative other than that listed in the HPI Respiratory: negative other than that listed in the HPI Cardiovascular: negative other than that listed in the HPI Gastrointestinal: negative other than that listed in the HPI Genitourinary: negative other than that listed in the HPI Integument: negative other than that listed in the HPI Hematologic: negative other than that listed in the  HPI Musculoskeletal: negative other than that listed in the HPI Neurological: negative other than that listed in the HPI Allergy/Immunologic: negative other than that listed in the HPI    Objective:   Blood pressure 110/65, pulse 60, temperature 97.8 F (36.6 C), temperature source Oral, resp. rate 16, SpO2 97 %. There is no height or weight on file to calculate BMI.   Physical Exam:  General: Alert, interactive, in no acute distress. Very pleasant appreciated male. Appears were palpable of the left; HEENT: TMs pearly gray, turbinates edematous without discharge, post-pharynx mildly erythematous. Neck: Supple without thyromegaly. Adenopathy: no enlarged lymph nodes appreciated in the anterior cervical, occipital, axillary, epitrochlear, inguinal, or popliteal regions Lungs: Clear to auscultation without wheezing, rhonchi or rales. No increased work of breathing. CV: Normal S1, S2 without murmurs. Capillary refill <2 seconds.  Abdomen: Nondistended, nontender. Skin: Warm and dry, without lesions or rashes. Extremities:  No clubbing, cyanosis or edema. Neuro:   Grossly intact.   Diagnostic studies:  Spirometry: results abnormal (FEV1: 2.57/92%, FVC: 4.28/112%, FEV1/FVC: 60%).    Spirometry consistent with mild obstructive disease   Allergy Studies: None   Salvatore Marvel, MD [facility]

## 2016-01-29 ENCOUNTER — Other Ambulatory Visit: Payer: Self-pay | Admitting: Cardiology

## 2016-03-03 ENCOUNTER — Other Ambulatory Visit: Payer: Self-pay | Admitting: Cardiology

## 2016-03-03 MED ORDER — METOPROLOL TARTRATE 25 MG PO TABS: 12.5000 mg | ORAL_TABLET | Freq: Two times a day (BID) | ORAL | 0 refills | Status: DC

## 2016-03-03 NOTE — Telephone Encounter (Signed)
1. Which medications need to be refilled? (please list name of each medication and dose if known)  metoprolol tartrate (LOPRESSOR) 25 MG tablet [id number]    2. Which pharmacy/location (including street and city if local pharmacy) is medication to be sent to? [place]   3. Do they need a 30 day or 90 day supply? 90 day supply

## 2016-03-03 NOTE — Telephone Encounter (Signed)
Overdue for 1 yr apt,needs apt,20 day supply given,front desk aware

## 2016-03-08 ENCOUNTER — Other Ambulatory Visit: Payer: Self-pay | Admitting: Cardiology

## 2016-07-04 ENCOUNTER — Ambulatory Visit: Payer: Medicare Other | Admitting: Allergy & Immunology

## 2016-07-21 DIAGNOSIS — I10 Essential (primary) hypertension: Secondary | ICD-10-CM | POA: Diagnosis not present

## 2016-07-21 DIAGNOSIS — Z79899 Other long term (current) drug therapy: Secondary | ICD-10-CM | POA: Insufficient documentation

## 2016-07-21 DIAGNOSIS — T18120A Food in esophagus causing compression of trachea, initial encounter: Secondary | ICD-10-CM | POA: Insufficient documentation

## 2016-07-21 DIAGNOSIS — I2510 Atherosclerotic heart disease of native coronary artery without angina pectoris: Secondary | ICD-10-CM | POA: Insufficient documentation

## 2016-07-21 DIAGNOSIS — Y929 Unspecified place or not applicable: Secondary | ICD-10-CM | POA: Diagnosis not present

## 2016-07-21 DIAGNOSIS — J45909 Unspecified asthma, uncomplicated: Secondary | ICD-10-CM | POA: Insufficient documentation

## 2016-07-21 DIAGNOSIS — X58XXXA Exposure to other specified factors, initial encounter: Secondary | ICD-10-CM | POA: Insufficient documentation

## 2016-07-21 DIAGNOSIS — Y939 Activity, unspecified: Secondary | ICD-10-CM | POA: Diagnosis not present

## 2016-07-21 DIAGNOSIS — K219 Gastro-esophageal reflux disease without esophagitis: Secondary | ICD-10-CM | POA: Diagnosis not present

## 2016-07-21 DIAGNOSIS — Z7982 Long term (current) use of aspirin: Secondary | ICD-10-CM | POA: Diagnosis not present

## 2016-07-21 DIAGNOSIS — Y999 Unspecified external cause status: Secondary | ICD-10-CM | POA: Insufficient documentation

## 2016-07-22 ENCOUNTER — Emergency Department (HOSPITAL_COMMUNITY)
Admission: EM | Admit: 2016-07-22 | Discharge: 2016-07-22 | Disposition: A | Discharge status: Home / Self Care | Payer: Medicare Other | Attending: Emergency Medicine | Admitting: Emergency Medicine

## 2016-07-22 ENCOUNTER — Encounter (HOSPITAL_COMMUNITY): Payer: Self-pay

## 2016-07-22 ENCOUNTER — Emergency Department (HOSPITAL_COMMUNITY): Payer: Medicare Other

## 2016-07-22 DIAGNOSIS — T18128A Food in esophagus causing other injury, initial encounter: Secondary | ICD-10-CM

## 2016-07-22 DIAGNOSIS — K219 Gastro-esophageal reflux disease without esophagitis: Secondary | ICD-10-CM

## 2016-07-22 LAB — CBC WITH DIFFERENTIAL/PLATELET
BASOS ABS: 0 10*3/uL (ref 0.0–0.1)
BASOS PCT: 0 %
Eosinophils Absolute: 0.1 10*3/uL (ref 0.0–0.7)
Eosinophils Relative: 1 %
HCT: 39.6 % (ref 39.0–52.0)
Hemoglobin: 13.7 g/dL (ref 13.0–17.0)
Lymphocytes Relative: 13 %
Lymphs Abs: 1.2 10*3/uL (ref 0.7–4.0)
MCH: 30.4 pg (ref 26.0–34.0)
MCHC: 34.6 g/dL (ref 30.0–36.0)
MCV: 87.8 fL (ref 78.0–100.0)
Monocytes Absolute: 0.6 10*3/uL (ref 0.1–1.0)
Monocytes Relative: 7 %
NEUTROS ABS: 7.7 10*3/uL (ref 1.7–7.7)
Neutrophils Relative %: 79 %
Platelets: 178 10*3/uL (ref 150–400)
RBC: 4.51 MIL/uL (ref 4.22–5.81)
RDW: 13.4 % (ref 11.5–15.5)
WBC: 9.7 10*3/uL (ref 4.0–10.5)

## 2016-07-22 LAB — BASIC METABOLIC PANEL
ANION GAP: 9 (ref 5–15)
BUN: 25 mg/dL — ABNORMAL HIGH (ref 6–20)
CALCIUM: 9.2 mg/dL (ref 8.9–10.3)
CO2: 23 mmol/L (ref 22–32)
Chloride: 108 mmol/L (ref 101–111)
Creatinine, Ser: 1.08 mg/dL (ref 0.61–1.24)
GFR calc non Af Amer: >60 mL/min (ref >60)
Glucose, Bld: 117 mg/dL — ABNORMAL HIGH (ref 65–99)
Potassium: 4.1 mmol/L (ref 3.5–5.1)
SODIUM: 140 mmol/L (ref 135–145)

## 2016-07-22 MED ORDER — OMEPRAZOLE 20 MG PO CPDR: DELAYED_RELEASE_CAPSULE | ORAL | 0 refills | Status: DC

## 2016-07-22 MED ORDER — GLUCAGON HCL RDNA (DIAGNOSTIC) 1 MG IJ SOLR
1.0000 mg | Freq: Once | INTRAMUSCULAR | Status: AC
  Administered 2016-07-22: 1 mg via INTRAVENOUS
  Filled 2016-07-22: qty 1

## 2016-07-22 MED ORDER — SODIUM CHLORIDE 0.9 % IV SOLN
INTRAVENOUS | Status: DC
  Administered 2016-07-22: 02:00:00 via INTRAVENOUS

## 2016-07-22 NOTE — ED Provider Notes (Signed)
[facility] DEPT Provider Note   CSN: [id number] Arrival date & time: 07/21/16  2357  Time seen 01:05 AM   History   Chief Complaint Chief Complaint  Patient presents with  . Food stuck in esophagus    HPI Ryan Hansen is a 83 y.o. male.  HPI  patient states he was outside all day working in the yard. About 6:30 PM he came in and 8 a bone egg and a cracker and since then he has felt like he had something stuck in his esophagus. He points to his lower chest. He states if he drinks an even when his saliva collects he is having vomiting. He feels like it is not passing. He states he has noticed sometimes his food is slow to go down but not recently. He states he's never had endoscopy done. He denies chest pain, shortness of breath, or fever.  PCP Robert Bellow, MD   Past Medical History:  Diagnosis Date  . Aortic stenosis   . Asthma   . BPH (benign prostatic hyperplasia)   . CAD (coronary artery disease)    catheterization with 60% DX1, less than 50% circumflex, 40% LRAS  . Elevated cholesterol   . GERD (gastroesophageal reflux disease)   . Hypertension   . Murmur 12/24/12    Patient Active Problem List   Diagnosis Date Noted  . Severe persistent asthma 12/07/2015  . Chronic rhinitis 12/07/2015  . CAD (coronary artery disease) 02/18/2014  . Hyperlipidemia 02/18/2014  . HEMORRHOIDS 09/22/2008  . GERD 09/22/2008  . ELEVATED PROSTATE SPECIFIC ANTIGEN 09/22/2008  . HEMATOCHEZIA, HX OF 09/22/2008    Past Surgical History:  Procedure Laterality Date  . CARDIAC CATHETERIZATION Left   . No previous surgery         Home Medications    Prior to Admission medications   Medication Sig Start Date End Date Taking? Authorizing Provider  aspirin EC 81 MG tablet Take 81 mg by mouth every morning.    Historical Provider, MD  budesonide-formoterol (SYMBICORT) 160-4.5 MCG/ACT inhaler Use 2 puffs twice daily to prevent cough or wheeze. Rinse, gargle and spit after use.  12/07/15   Valentina Shaggy, MD  fluticasone (FLONASE) 50 MCG/ACT nasal spray Place 2 sprays into both nostrils daily. 12/07/15   Valentina Shaggy, MD  KRILL OIL PO Take 1 tablet by mouth every morning.    Historical Provider, MD  metoprolol tartrate (LOPRESSOR) 25 MG tablet Take 0.5 tablets (12.5 mg total) by mouth 2 (two) times daily. 03/03/16   Arnoldo Lenis, MD  metoprolol tartrate (LOPRESSOR) 25 MG tablet TAKE ONE-HALF TABLET BY MOUTH TWICE A DAY 03/08/16   Arnoldo Lenis, MD  mirtazapine (REMERON) 15 MG tablet Take 3.75 mg by mouth at bedtime as needed.    Historical Provider, MD  Multiple Vitamins-Minerals (OCUVITE ADULT 50+ PO) Take by mouth.    Historical Provider, MD  omeprazole (PRILOSEC) 20 MG capsule Take 1 po BID x 2 weeks then once a day 07/22/16   Rolland Porter, MD  PROAIR HFA 108 (90 BASE) MCG/ACT inhaler Inhale 1-2 puffs into the lungs every 6 (six) hours as needed for wheezing or shortness of breath. 03/02/15   Arnoldo Lenis, MD  simvastatin (ZOCOR) 40 MG tablet TAKE ONE (1) TABLET EACH DAY 06/21/15   Lelon Perla, MD  Spacer/Aero-Holding Chambers (AEROCHAMBER PLUS) inhaler Use as instructed Patient not taking: Reported on 01/04/2016 12/07/15   Valentina Shaggy, MD    Family History  Family History  Problem Relation Age of Onset  . Heart disease      No family history  . Allergic rhinitis Neg Hx   . Asthma Neg Hx   . Eczema Neg Hx   . Immunodeficiency Neg Hx   . Urticaria Neg Hx   . Angioedema Neg Hx     Social History Social History  Substance Use Topics  . Smoking status: Never Smoker  . Smokeless tobacco: Never Used  . Alcohol use No  lives at home Lives with spouse   Allergies   Procaine hcl   Review of Systems Review of Systems  All other systems reviewed and are negative.    Physical Exam Updated Vital Signs BP (!) 181/76   Pulse 63   Temp 98.5 F (36.9 C) (Oral)   Resp 17   Ht 6' (1.829 m)   Wt 160 lb (72.6 kg)   SpO2  97%   BMI 21.70 kg/m   Vital signs normal except for hypertension   Physical Exam  Constitutional: He is oriented to person, place, and time. He appears well-developed and well-nourished.  Non-toxic appearance. He does not appear ill. No distress.  HENT:  Head: Normocephalic and atraumatic.  Right Ear: External ear normal.  Left Ear: External ear normal.  Nose: Nose normal. No mucosal edema or rhinorrhea.  Mouth/Throat: Oropharynx is clear and moist and mucous membranes are normal. No dental abscesses or uvula swelling.  Eyes: Conjunctivae and EOM are normal. Pupils are equal, round, and reactive to light.  Neck: Normal range of motion and full passive range of motion without pain. Neck supple.  Cardiovascular: Normal rate, regular rhythm and normal heart sounds.  Exam reveals no gallop and no friction rub.   No murmur heard. Pulmonary/Chest: Effort normal and breath sounds normal. No respiratory distress. He has no wheezes. He has no rhonchi. He has no rales. He exhibits no tenderness and no crepitus.  Abdominal: Soft. Normal appearance and bowel sounds are normal. He exhibits no distension. There is no tenderness. There is no rebound and no guarding.  Musculoskeletal: Normal range of motion. He exhibits edema.  Moves all extremities well.   Neurological: He is alert and oriented to person, place, and time. He has normal strength. No cranial nerve deficit.  Skin: Skin is warm, dry and intact. No rash noted. No erythema. No pallor.  Psychiatric: He has a normal mood and affect. His speech is normal and behavior is normal. His mood appears not anxious.  Nursing note and vitals reviewed.    ED Treatments / Results  Labs (all labs ordered are listed, but only abnormal results are displayed) Results for orders placed or performed during the hospital encounter of 69/45/03  Basic metabolic panel  Result Value Ref Range   Sodium 140 135 - 145 mmol/L   Potassium 4.1 3.5 - 5.1 mmol/L    Chloride 108 101 - 111 mmol/L   CO2 23 22 - 32 mmol/L   Glucose, Bld 117 (H) 65 - 99 mg/dL   BUN 25 (H) 6 - 20 mg/dL   Creatinine, Ser 1.08 0.61 - 1.24 mg/dL   Calcium 9.2 8.9 - 10.3 mg/dL   GFR calc non Af Amer >60 >60 mL/min   GFR calc Af Amer >60 >60 mL/min   Anion gap 9 5 - 15  CBC with Differential  Result Value Ref Range   WBC 9.7 4.0 - 10.5 K/uL   RBC 4.51 4.22 - 5.81 MIL/uL   Hemoglobin  13.7 13.0 - 17.0 g/dL   HCT 39.6 39.0 - 52.0 %   MCV 87.8 78.0 - 100.0 fL   MCH 30.4 26.0 - 34.0 pg   MCHC 34.6 30.0 - 36.0 g/dL   RDW 13.4 11.5 - 15.5 %   Platelets 178 150 - 400 K/uL   Neutrophils Relative % 79 %   Neutro Abs 7.7 1.7 - 7.7 K/uL   Lymphocytes Relative 13 %   Lymphs Abs 1.2 0.7 - 4.0 K/uL   Monocytes Relative 7 %   Monocytes Absolute 0.6 0.1 - 1.0 K/uL   Eosinophils Relative 1 %   Eosinophils Absolute 0.1 0.0 - 0.7 K/uL   Basophils Relative 0 %   Basophils Absolute 0.0 0.0 - 0.1 K/uL   Laboratory interpretation all normal except elevated BUN.    EKG  EKG Interpretation None       Radiology Dg Chest Port 1 View  Result Date: 07/22/2016 CLINICAL DATA:  Ate hard-boiled egg and crackers this evening. Sensation of food stuck in esophagus. Initial encounter. EXAM: PORTABLE CHEST 1 VIEW COMPARISON:  Chest radiograph performed 07/09/2015 FINDINGS: The lungs are well-aerated and clear. There is no evidence of focal opacification, pleural effusion or pneumothorax. The cardiomediastinal silhouette is within normal limits. No acute osseous abnormalities are seen. No radiopaque foreign bodies are seen. IMPRESSION: No acute cardiopulmonary process seen. Electronically Signed   By: Garald Balding M.D.   On: 07/22/2016 01:43    Procedures Procedures (including critical care time)  Medications Ordered in ED Medications  0.9 %  sodium chloride infusion ( Intravenous Stopped 07/22/16 0301)  glucagon (human recombinant) (GLUCAGEN) injection 1 mg (1 mg Intravenous Given 07/22/16  0142)     Initial Impression / Assessment and Plan / ED Course  I have reviewed the triage vital signs and the nursing notes.  Pertinent labs & imaging results that were available during my care of the patient were reviewed by me and considered in my medical decision making (see chart for details).  Patient denies feeling that he has something caught up high in his throat or any upper chest, therefore IV glucagon was tried to see if that would help him pass the foreign body. In anticipation that he would need endoscopy for removal of the esophageal food impaction laboratory testing was done and chest x-ray was done.  Recheck at 02:45 AM pt is now able to drink without difficulty. States he feels great. Patient now states he has been having heartburn at times over the past few weeks, especially if he eats sweet things late at night. We discussed he should follow-up with Dr. Melony Overly who he thinks he saw 20/30 years ago for colonoscopy. He was started on omeprazole for his reflux symptoms. He was advised to chew his food well and to make sure he has plenty of liquids to drink if he is eating dry food. He should return to the ED if he feels like he has food stuck again.  Final Clinical Impressions(s) / ED Diagnoses   Final diagnoses:  Food impaction of esophagus, initial encounter  Gastroesophageal reflux disease without esophagitis    New Prescriptions New Prescriptions   OMEPRAZOLE (PRILOSEC) 20 MG CAPSULE    Take 1 po BID x 2 weeks then once a day   Plan discharge  Rolland Porter, MD, Barbette Or, MD 07/22/16 [phone]

## 2016-07-22 NOTE — Discharge Instructions (Signed)
Chew your food well and make sure you have liquids to drink when you eat dry foods. Take the omeprazole for your heartburn. Call Dr Olevia Perches office to get an appointment to have him evaluate your difficulty swallowing tonight. Look at the gastroesophageal reflux disease information. Return to the ED if you feel you have food stuck again.

## 2016-07-22 NOTE — ED Triage Notes (Addendum)
Pt states he ate a hard-boiled egg and some crackers at approx 6 pm, states he started feeling like it was stuck and would not go down.  Pt is now sipping some water and it seems to have improved some.  Pt was able to keep most of the water down but now is spitting up some of it and feels there is still something there. Pt denies breathing problems, nad

## 2017-05-07 ENCOUNTER — Other Ambulatory Visit: Payer: Self-pay

## 2017-05-07 ENCOUNTER — Ambulatory Visit (HOSPITAL_COMMUNITY)
Admission: RE | Admit: 2017-05-07 | Discharge: 2017-05-07 | Disposition: A | Discharge status: Home / Self Care | Payer: Medicare Other | Source: Ambulatory Visit | Attending: Family Medicine | Admitting: Family Medicine

## 2017-05-07 ENCOUNTER — Other Ambulatory Visit (HOSPITAL_COMMUNITY): Payer: Self-pay | Admitting: Family Medicine

## 2017-05-07 ENCOUNTER — Emergency Department (HOSPITAL_COMMUNITY)
Admission: EM | Admit: 2017-05-07 | Discharge: 2017-05-07 | Disposition: A | Discharge status: Home / Self Care | Payer: Medicare Other | Attending: Emergency Medicine | Admitting: Emergency Medicine

## 2017-05-07 ENCOUNTER — Encounter (HOSPITAL_COMMUNITY): Payer: Self-pay | Admitting: Emergency Medicine

## 2017-05-07 DIAGNOSIS — Z7982 Long term (current) use of aspirin: Secondary | ICD-10-CM | POA: Insufficient documentation

## 2017-05-07 DIAGNOSIS — N3091 Cystitis, unspecified with hematuria: Secondary | ICD-10-CM | POA: Diagnosis not present

## 2017-05-07 DIAGNOSIS — J45909 Unspecified asthma, uncomplicated: Secondary | ICD-10-CM | POA: Diagnosis not present

## 2017-05-07 DIAGNOSIS — N200 Calculus of kidney: Secondary | ICD-10-CM | POA: Insufficient documentation

## 2017-05-07 DIAGNOSIS — I10 Essential (primary) hypertension: Secondary | ICD-10-CM | POA: Diagnosis not present

## 2017-05-07 DIAGNOSIS — N289 Disorder of kidney and ureter, unspecified: Secondary | ICD-10-CM | POA: Insufficient documentation

## 2017-05-07 DIAGNOSIS — I2510 Atherosclerotic heart disease of native coronary artery without angina pectoris: Secondary | ICD-10-CM | POA: Diagnosis not present

## 2017-05-07 DIAGNOSIS — Z79899 Other long term (current) drug therapy: Secondary | ICD-10-CM | POA: Insufficient documentation

## 2017-05-07 DIAGNOSIS — R310 Gross hematuria: Secondary | ICD-10-CM

## 2017-05-07 DIAGNOSIS — R319 Hematuria, unspecified: Secondary | ICD-10-CM | POA: Diagnosis present

## 2017-05-07 LAB — URINALYSIS, ROUTINE W REFLEX MICROSCOPIC
BACTERIA UA: NONE SEEN
BILIRUBIN URINE: NEGATIVE
Glucose, UA: NEGATIVE mg/dL
Ketones, ur: NEGATIVE mg/dL
Leukocytes, UA: NEGATIVE
Nitrite: NEGATIVE
PROTEIN: 100 mg/dL — AB
SPECIFIC GRAVITY, URINE: 1.012 (ref 1.005–1.030)
Squamous Epithelial / LPF: NONE SEEN
pH: 6 (ref 5.0–8.0)

## 2017-05-07 MED ORDER — CEFTRIAXONE SODIUM 1 G IJ SOLR
1.0000 g | Freq: Once | INTRAMUSCULAR | Status: AC
Start: 2017-05-07 — End: 2017-05-07
  Administered 2017-05-07: 1 g via INTRAMUSCULAR
  Filled 2017-05-07: qty 10

## 2017-05-07 MED ORDER — LIDOCAINE HCL (PF) 1 % IJ SOLN
INTRAMUSCULAR | Status: AC
  Filled 2017-05-07: qty 2

## 2017-05-07 NOTE — ED Provider Notes (Signed)
[facility] EMERGENCY DEPARTMENT Provider Note   CSN: [id number] Arrival date & time: 05/07/17  4098     History   Chief Complaint Chief Complaint  Patient presents with  . Hematuria    HPI Ryan Hansen is a 84 y.o. male.  She presents to the emergency department for evaluation of hematuria.  Symptoms ongoing for 2 days.  Patient reports that he first started having urinary frequency and noticed some slight blood streaking.  He saw his doctor and was given an antibiotic for a urinary tract infection.  Since then, over the last 2 days, however, the bleeding has worsened.  He has now passing gross blood and he has been passing clots.  Here in the ER he feels like he cannot pass any urine.  He has a fullness in the area of the bladder but no pain.  He has not had fever, nausea, vomiting.      Past Medical History:  Diagnosis Date  . Aortic stenosis   . Asthma   . BPH (benign prostatic hyperplasia)   . CAD (coronary artery disease)    catheterization with 60% DX1, less than 50% circumflex, 40% LRAS  . Elevated cholesterol   . GERD (gastroesophageal reflux disease)   . Hypertension   . Murmur 12/24/12    Patient Active Problem List   Diagnosis Date Noted  . Severe persistent asthma 12/07/2015  . Chronic rhinitis 12/07/2015  . CAD (coronary artery disease) 02/18/2014  . Hyperlipidemia 02/18/2014  . HEMORRHOIDS 09/22/2008  . GERD 09/22/2008  . ELEVATED PROSTATE SPECIFIC ANTIGEN 09/22/2008  . HEMATOCHEZIA, HX OF 09/22/2008    Past Surgical History:  Procedure Laterality Date  . CARDIAC CATHETERIZATION Left   . No previous surgery         Home Medications    Prior to Admission medications   Medication Sig Start Date End Date Taking? Authorizing Provider  aspirin EC 81 MG tablet Take 81 mg by mouth every morning.   Yes [provider]  budesonide-formoterol (SYMBICORT) 160-4.5 MCG/ACT inhaler Use 2 puffs twice daily to prevent cough or wheeze. Rinse,  gargle and spit after use. 12/07/15  Yes Valentina Shaggy, MD  fluticasone [facility]) 50 MCG/ACT nasal spray Place 2 sprays into both nostrils daily. 12/07/15  Yes Valentina Shaggy, MD  KRILL OIL PO Take 1 tablet by mouth every morning.   Yes [provider]  metoprolol tartrate (LOPRESSOR) 25 MG tablet Take 0.5 tablets (12.5 mg total) by mouth 2 (two) times daily. 03/03/16  Yes Branch, Alphonse Guild, MD  metoprolol tartrate (LOPRESSOR) 25 MG tablet TAKE ONE-HALF TABLET BY MOUTH TWICE A DAY 03/08/16  Yes Branch, Alphonse Guild, MD  mirtazapine (REMERON) 15 MG tablet Take 3.75 mg by mouth at bedtime as needed.   Yes [provider]  Multiple Vitamins-Minerals (OCUVITE ADULT 50+ PO) Take by mouth.   Yes [provider]  omeprazole (PRILOSEC) 20 MG capsule Take 1 po BID x 2 weeks then once a day 07/22/16  Yes Rolland Porter, MD  PROAIR HFA 108 (90 BASE) MCG/ACT inhaler Inhale 1-2 puffs into the lungs every 6 (six) hours as needed for wheezing or shortness of breath. 03/02/15  Yes Arnoldo Lenis, MD  simvastatin (ZOCOR) 40 MG tablet TAKE ONE (1) TABLET EACH DAY 06/21/15  Yes Lelon Perla, MD  Spacer/Aero-Holding Chambers (AEROCHAMBER PLUS) inhaler Use as instructed 12/07/15  Yes Valentina Shaggy, MD    Family History Family History  Problem Relation Age  of Onset  . Heart disease Unknown        No family history  . Allergic rhinitis Neg Hx   . Asthma Neg Hx   . Eczema Neg Hx   . Immunodeficiency Neg Hx   . Urticaria Neg Hx   . Angioedema Neg Hx     Social History Social History   Tobacco Use  . Smoking status: Never Smoker  . Smokeless tobacco: Never Used  Substance Use Topics  . Alcohol use: No  . Drug use: No     Allergies   Procaine hcl   Review of Systems Review of Systems  Genitourinary: Positive for decreased urine volume, frequency and hematuria.  All other systems reviewed and are negative.    Physical Exam Updated Vital Signs BP  (!) 170/72 (BP Location: Left Arm)   Pulse 62   Temp 97.8 F (36.6 C) (Oral)   Resp 20   Ht 6' (1.829 m)   Wt 74.8 kg (165 lb)   SpO2 95%   BMI 22.38 kg/m   Physical Exam  Constitutional: He is oriented to person, place, and time. He appears well-developed and well-nourished. No distress.  HENT:  Head: Normocephalic and atraumatic.  Right Ear: Hearing normal.  Left Ear: Hearing normal.  Nose: Nose normal.  Mouth/Throat: Oropharynx is clear and moist and mucous membranes are normal.  Eyes: Conjunctivae and EOM are normal. Pupils are equal, round, and reactive to light.  Neck: Normal range of motion. Neck supple.  Cardiovascular: Regular rhythm, S1 normal and S2 normal. Exam reveals no gallop and no friction rub.  No murmur heard. Pulmonary/Chest: Effort normal and breath sounds normal. No respiratory distress. He exhibits no tenderness.  Abdominal: Soft. Normal appearance and bowel sounds are normal. There is no hepatosplenomegaly. There is no tenderness. There is no rebound, no guarding, no tenderness at McBurney's point and negative Murphy's sign. No hernia.  Musculoskeletal: Normal range of motion.  Neurological: He is alert and oriented to person, place, and time. He has normal strength. No cranial nerve deficit or sensory deficit. Coordination normal. GCS eye subscore is 4. GCS verbal subscore is 5. GCS motor subscore is 6.  Skin: Skin is warm, dry and intact. No rash noted. No cyanosis.  Psychiatric: He has a normal mood and affect. His speech is normal and behavior is normal. Thought content normal.  Nursing note and vitals reviewed.    ED Treatments / Results  Labs (all labs ordered are listed, but only abnormal results are displayed) Labs Reviewed  URINALYSIS, ROUTINE W REFLEX MICROSCOPIC - Abnormal; Notable for the following components:      Result Value   Color, Urine AMBER (*)    APPearance CLOUDY (*)    Hgb urine dipstick LARGE (*)    Protein, ur 100 (*)    All  other components within normal limits  URINE CULTURE    EKG  EKG Interpretation None       Radiology No results found.  Procedures Procedures (including critical care time)  Medications Ordered in ED Medications  cefTRIAXone (ROCEPHIN) injection 1 g (not administered)     Initial Impression / Assessment and Plan / ED Course  I have reviewed the triage vital signs and the nursing notes.  Pertinent labs & imaging results that were available during my care of the patient were reviewed by me and considered in my medical decision making (see chart for details).     Patient presents to the emergency department for evaluation  of hematuria.  Patient reports that he saw his doctor 2 days ago for this and was given an antibiotic.  He did not bring the antibiotic and does not know what he is on.  He has noticed increasing bleeding.  A bladder scan showed only 45 mL of urine, no evidence of urinary obstruction from clots.  He does, however, report that he has been passing clots.  Urinalysis shows too numerous to count red blood cells but no obvious infection.  Suspect that this did start as a urinary tract infection, but will require urology follow-up.  Will refer to urology for outpatient follow-up.  As I do not know what antibiotic he is on, I cannot make any changes to his medications.  We will give him Rocephin here in the ER and await culture results.  Final Clinical Impressions(s) / ED Diagnoses   Final diagnoses:  Hemorrhagic cystitis    ED Discharge Orders    None       Avriel Kandel, Gwenyth Allegra, MD 05/07/17 0246

## 2017-05-07 NOTE — ED Triage Notes (Signed)
Patient has start having blood in urine when urinating.

## 2017-05-08 LAB — URINE CULTURE: Culture: NO GROWTH

## 2017-05-09 ENCOUNTER — Ambulatory Visit: Payer: Medicare Other | Admitting: Urology

## 2017-05-09 DIAGNOSIS — N400 Enlarged prostate without lower urinary tract symptoms: Secondary | ICD-10-CM | POA: Diagnosis not present

## 2017-05-09 DIAGNOSIS — R310 Gross hematuria: Secondary | ICD-10-CM | POA: Diagnosis not present

## 2017-05-15 ENCOUNTER — Other Ambulatory Visit: Payer: Self-pay | Admitting: Urology

## 2017-05-15 DIAGNOSIS — R310 Gross hematuria: Secondary | ICD-10-CM

## 2017-06-06 ENCOUNTER — Ambulatory Visit (HOSPITAL_COMMUNITY)
Admission: RE | Admit: 2017-06-06 | Discharge: 2017-06-06 | Disposition: A | Discharge status: Home / Self Care | Payer: Medicare Other | Source: Ambulatory Visit | Attending: Urology | Admitting: Urology

## 2017-06-06 DIAGNOSIS — N400 Enlarged prostate without lower urinary tract symptoms: Secondary | ICD-10-CM | POA: Insufficient documentation

## 2017-06-06 DIAGNOSIS — N289 Disorder of kidney and ureter, unspecified: Secondary | ICD-10-CM | POA: Insufficient documentation

## 2017-06-06 DIAGNOSIS — I700 Atherosclerosis of aorta: Secondary | ICD-10-CM | POA: Diagnosis not present

## 2017-06-06 DIAGNOSIS — N200 Calculus of kidney: Secondary | ICD-10-CM | POA: Diagnosis not present

## 2017-06-06 DIAGNOSIS — R310 Gross hematuria: Secondary | ICD-10-CM

## 2017-06-06 LAB — POCT I-STAT CREATININE: Creatinine, Ser: 1.1 mg/dL (ref 0.61–1.24)

## 2017-06-06 MED ORDER — IOPAMIDOL (ISOVUE-300) INJECTION 61%
125.0000 mL | Freq: Once | INTRAVENOUS | Status: AC | PRN
  Administered 2017-06-06: 125 mL via INTRAVENOUS

## 2017-06-15 ENCOUNTER — Ambulatory Visit: Payer: Medicare Other | Admitting: Urology

## 2017-06-15 DIAGNOSIS — R310 Gross hematuria: Secondary | ICD-10-CM | POA: Diagnosis not present

## 2019-07-23 ENCOUNTER — Other Ambulatory Visit: Payer: Self-pay

## 2019-07-23 ENCOUNTER — Ambulatory Visit (INDEPENDENT_AMBULATORY_CARE_PROVIDER_SITE_OTHER): Payer: Medicare PPO

## 2019-07-23 ENCOUNTER — Ambulatory Visit
Admission: EM | Admit: 2019-07-23 | Discharge: 2019-07-23 | Disposition: A | Discharge status: Home / Self Care | Payer: Medicare PPO | Attending: Emergency Medicine | Admitting: Emergency Medicine

## 2019-07-23 DIAGNOSIS — M79604 Pain in right leg: Secondary | ICD-10-CM

## 2019-07-23 NOTE — ED Triage Notes (Signed)
Right knee.

## 2019-07-23 NOTE — ED Provider Notes (Signed)
[facility]    CSN: [id number] Arrival date & time: 07/23/19  1348      History   Chief Complaint Chief Complaint  Patient presents with  . Fall    HPI Ryan Hansen is a 86 y.o. male.   Presented to the urgent care with a complaint of right knee, and right tibia pain for the past 3 days.  Report he fell down 10 steps.  Localized pain to the right leg below the right knee and right tibia.  Describes the pain as constant and achy, and rated at 7 on a scale of 1-10.  Has tried OTC meds Tylenol/ibuprofen with mild relief.  Symptoms are made worse with range of motion.  He denies similar symptoms in the past.  Denies chills, fever, nausea, vomiting, diarrhea, confusion, diplopia, blurred vision, chest pain, chest tightness.      Past Medical History:  Diagnosis Date  . Aortic stenosis   . Asthma   . BPH (benign prostatic hyperplasia)   . CAD (coronary artery disease)    catheterization with 60% DX1, less than 50% circumflex, 40% LRAS  . Elevated cholesterol   . GERD (gastroesophageal reflux disease)   . Hypertension   . Murmur 12/24/12    Patient Active Problem List   Diagnosis Date Noted  . Severe persistent asthma 12/07/2015  . Chronic rhinitis 12/07/2015  . CAD (coronary artery disease) 02/18/2014  . Hyperlipidemia 02/18/2014  . HEMORRHOIDS 09/22/2008  . GERD 09/22/2008  . ELEVATED PROSTATE SPECIFIC ANTIGEN 09/22/2008  . HEMATOCHEZIA, HX OF 09/22/2008    Past Surgical History:  Procedure Laterality Date  . CARDIAC CATHETERIZATION Left   . No previous surgery         Home Medications    Prior to Admission medications   Medication Sig Start Date End Date Taking? Authorizing Provider  aspirin EC 81 MG tablet Take 81 mg by mouth every morning.    [provider]  budesonide-formoterol (SYMBICORT) 160-4.5 MCG/ACT inhaler Use 2 puffs twice daily to prevent cough or wheeze. Rinse, gargle and spit after use. 12/07/15   Valentina Shaggy, MD   fluticasone [facility]) 50 MCG/ACT nasal spray Place 2 sprays into both nostrils daily. 12/07/15   Valentina Shaggy, MD  KRILL OIL PO Take 1 tablet by mouth every morning.    [provider]  metoprolol tartrate (LOPRESSOR) 25 MG tablet Take 0.5 tablets (12.5 mg total) by mouth 2 (two) times daily. 03/03/16   Arnoldo Lenis, MD  metoprolol tartrate (LOPRESSOR) 25 MG tablet TAKE ONE-HALF TABLET BY MOUTH TWICE A DAY 03/08/16   Arnoldo Lenis, MD  mirtazapine (REMERON) 15 MG tablet Take 3.75 mg by mouth at bedtime as needed.    [provider]  Multiple Vitamins-Minerals (OCUVITE ADULT 50+ PO) Take by mouth.    [provider]  omeprazole (PRILOSEC) 20 MG capsule Take 1 po BID x 2 weeks then once a day 07/22/16   Rolland Porter, MD  [facility] HFA 108 (90 BASE) MCG/ACT inhaler Inhale 1-2 puffs into the lungs every 6 (six) hours as needed for wheezing or shortness of breath. 03/02/15   Arnoldo Lenis, MD  simvastatin (ZOCOR) 40 MG tablet TAKE ONE (1) TABLET EACH DAY 06/21/15   Lelon Perla, MD  Spacer/Aero-Holding Chambers (AEROCHAMBER PLUS) inhaler Use as instructed 12/07/15   Valentina Shaggy, MD    Family History Family History  Problem Relation Age of Onset  . Heart disease Unknown  No family history  . Allergic rhinitis Neg Hx   . Asthma Neg Hx   . Eczema Neg Hx   . Immunodeficiency Neg Hx   . Urticaria Neg Hx   . Angioedema Neg Hx     Social History Social History   Tobacco Use  . Smoking status: Never Smoker  . Smokeless tobacco: Never Used  Substance Use Topics  . Alcohol use: No  . Drug use: No     Allergies   Procaine hcl   Review of Systems Review of Systems  Constitutional: Negative.   Respiratory: Negative.   Cardiovascular: Negative.   Musculoskeletal: Positive for arthralgias.  All other systems reviewed and are negative.    Physical Exam Triage Vital Signs ED Triage Vitals [07/23/19 1401]  Enc Vitals  Group     BP      Pulse      Resp      Temp      Temp src      SpO2      Weight      Height      Head Circumference      Peak Flow      Pain Score 7     Pain Loc      Pain Edu?      Excl. in [place]?    No data found.  Updated Vital Signs BP (!) 161/81   Pulse 64   Temp 98.3 F (36.8 C)   Resp 18   SpO2 95%   Visual Acuity Right Eye Distance:   Left Eye Distance:   Bilateral Distance:    Right Eye Near:   Left Eye Near:    Bilateral Near:     Physical Exam Vitals and nursing note reviewed.  Constitutional:      General: He is not in acute distress.    Appearance: Normal appearance. He is normal weight. He is not ill-appearing, toxic-appearing or diaphoretic.  Cardiovascular:     Rate and Rhythm: Normal rate and regular rhythm.     Pulses: Normal pulses.     Heart sounds: Normal heart sounds. No murmur. No friction rub. No gallop.   Pulmonary:     Effort: Pulmonary effort is normal. No respiratory distress.     Breath sounds: Normal breath sounds. No stridor. No wheezing, rhonchi or rales.  Chest:     Chest wall: No tenderness.  Musculoskeletal:        General: Tenderness present. Normal range of motion.     Right knee: Tenderness present.     Left knee: Normal.     Right lower leg: Tenderness present. No swelling. No edema.     Left lower leg: Normal. No edema.     Comments: Patient is able to bear weight and ambulate with pain.  No surface trauma, or obvious effusion.  No overlying erythema or warmth.  Right knee is without any obvious asymmetry or deformity when compared to the left knee.  Partially able to deep bend knee, fully extend,  internal and external rotation.  NT to palpation on the patella, no effusion or ballottement.  Negative anterior drawer.  Neurological:     Mental Status: He is alert and oriented to person, place, and time.     Sensory: No sensory deficit.      UC Treatments / Results  Labs (all labs ordered are listed, but only abnormal  results are displayed) Labs Reviewed - No data to display  EKG   Radiology  DG Tibia/Fibula Right  Result Date: 07/23/2019 CLINICAL DATA:  Pain following fall EXAM: RIGHT TIBIA AND FIBULA - 2 VIEW COMPARISON:  None. FINDINGS: Frontal and lateral views were obtained. Bones are osteoporotic. No fracture or dislocation. Joint spaces appear normal. No evident knee or ankle joint effusion. IMPRESSION: Bones osteoporotic. No fracture or dislocation. No evident arthropathy. Electronically Signed   By: Lowella Grip III M.D.   On: 07/23/2019 14:29    Procedures Procedures (including critical care time)  Medications Ordered in UC Medications - No data to display  Initial Impression / Assessment and Plan / UC Course  I have reviewed the triage vital signs and the nursing notes.  Pertinent labs & imaging results that were available during my care of the patient were reviewed by me and considered in my medical decision making (see chart for details).    Patient is stable for discharge.  Right tibia/fibula x-ray is negative for  fracture or dislocation but shows osteoporotic bones.  I have reviewed the x-ray myself and the radiologist interpretation.  I am in agreement with the radiologist interpretation.  Take OTC Tylenol for pain management Advised to move his bedroom downstairs to reduce the risk of fall Follow-up with primary care provider Return or go to ED for worsening of symptoms  Final Clinical Impressions(s) / UC Diagnoses   Final diagnoses:  Right leg pain     Discharge Instructions     Advised to take OTC Tylenol for pain Advised for multivitamin with calcium and vitamin D Follow RICE instruction to manage pain Advised to move his bedroom downstairs to reduce the risk of fall Follow-up with PCP Return or go to ED for worsening of symptoms     ED Prescriptions    None     PDMP not reviewed this encounter.   Emerson Monte, [place] 07/23/19 1454

## 2019-07-23 NOTE — ED Triage Notes (Signed)
Pt states he fell down steps about  3 days ago , pain is below knee when walking .

## 2019-07-23 NOTE — Discharge Instructions (Addendum)
Advised to take OTC Tylenol for pain Advised for multivitamin with calcium and vitamin D Follow RICE instruction to manage pain Advised to move his bedroom downstairs to reduce the risk of fall Follow-up with PCP Return or go to ED for worsening of symptoms

## 2019-08-20 ENCOUNTER — Encounter (HOSPITAL_COMMUNITY): Payer: Self-pay | Admitting: Emergency Medicine

## 2019-08-20 ENCOUNTER — Emergency Department (HOSPITAL_COMMUNITY): Payer: Medicare PPO

## 2019-08-20 ENCOUNTER — Emergency Department (HOSPITAL_COMMUNITY)
Admission: EM | Admit: 2019-08-20 | Discharge: 2019-08-20 | Disposition: A | Discharge status: Home / Self Care | Payer: Medicare PPO | Attending: Emergency Medicine | Admitting: Emergency Medicine

## 2019-08-20 ENCOUNTER — Other Ambulatory Visit: Payer: Self-pay

## 2019-08-20 DIAGNOSIS — I2510 Atherosclerotic heart disease of native coronary artery without angina pectoris: Secondary | ICD-10-CM | POA: Diagnosis not present

## 2019-08-20 DIAGNOSIS — R1030 Lower abdominal pain, unspecified: Secondary | ICD-10-CM | POA: Diagnosis present

## 2019-08-20 DIAGNOSIS — Z7982 Long term (current) use of aspirin: Secondary | ICD-10-CM | POA: Diagnosis not present

## 2019-08-20 DIAGNOSIS — I10 Essential (primary) hypertension: Secondary | ICD-10-CM | POA: Diagnosis not present

## 2019-08-20 DIAGNOSIS — K5732 Diverticulitis of large intestine without perforation or abscess without bleeding: Secondary | ICD-10-CM | POA: Diagnosis not present

## 2019-08-20 DIAGNOSIS — Z79899 Other long term (current) drug therapy: Secondary | ICD-10-CM | POA: Insufficient documentation

## 2019-08-20 LAB — CBC WITH DIFFERENTIAL/PLATELET
Abs Immature Granulocytes: 0.02 10*3/uL (ref 0.00–0.07)
Basophils Absolute: 0 10*3/uL (ref 0.0–0.1)
Basophils Relative: 1 %
Eosinophils Absolute: 0.2 10*3/uL (ref 0.0–0.5)
Eosinophils Relative: 3 %
HCT: 41.2 % (ref 39.0–52.0)
Hemoglobin: 13.3 g/dL (ref 13.0–17.0)
Immature Granulocytes: 0 %
Lymphocytes Relative: 18 %
Lymphs Abs: 1.5 10*3/uL (ref 0.7–4.0)
MCH: 29.1 pg (ref 26.0–34.0)
MCHC: 32.3 g/dL (ref 30.0–36.0)
MCV: 90.2 fL (ref 80.0–100.0)
Monocytes Absolute: 0.8 10*3/uL (ref 0.1–1.0)
Monocytes Relative: 10 %
Neutro Abs: 5.6 10*3/uL (ref 1.7–7.7)
Neutrophils Relative %: 68 %
Platelets: 216 10*3/uL (ref 150–400)
RBC: 4.57 MIL/uL (ref 4.22–5.81)
RDW: 13.7 % (ref 11.5–15.5)
WBC: 8.2 10*3/uL (ref 4.0–10.5)
nRBC: 0 % (ref 0.0–0.2)

## 2019-08-20 LAB — URINALYSIS, ROUTINE W REFLEX MICROSCOPIC
Bilirubin Urine: NEGATIVE
Glucose, UA: NEGATIVE mg/dL
Hgb urine dipstick: NEGATIVE
Ketones, ur: NEGATIVE mg/dL
Leukocytes,Ua: NEGATIVE
Nitrite: NEGATIVE
Protein, ur: NEGATIVE mg/dL
Specific Gravity, Urine: 1.013 (ref 1.005–1.030)
pH: 7 (ref 5.0–8.0)

## 2019-08-20 LAB — COMPREHENSIVE METABOLIC PANEL
ALT: 11 U/L (ref 0–44)
AST: 18 U/L (ref 15–41)
Albumin: 4.1 g/dL (ref 3.5–5.0)
Alkaline Phosphatase: 69 U/L (ref 38–126)
Anion gap: 10 (ref 5–15)
BUN: 24 mg/dL — ABNORMAL HIGH (ref 8–23)
CO2: 26 mmol/L (ref 22–32)
Calcium: 9 mg/dL (ref 8.9–10.3)
Chloride: 104 mmol/L (ref 98–111)
Creatinine, Ser: 0.99 mg/dL (ref 0.61–1.24)
GFR calc Af Amer: >60 mL/min (ref >60)
GFR calc non Af Amer: >60 mL/min (ref >60)
Glucose, Bld: 106 mg/dL — ABNORMAL HIGH (ref 70–99)
Potassium: 4 mmol/L (ref 3.5–5.1)
Sodium: 140 mmol/L (ref 135–145)
Total Bilirubin: 0.5 mg/dL (ref 0.3–1.2)
Total Protein: 7.5 g/dL (ref 6.5–8.1)

## 2019-08-20 MED ORDER — CIPROFLOXACIN HCL 250 MG PO TABS
500.0000 mg | ORAL_TABLET | Freq: Once | ORAL | Status: AC
  Administered 2019-08-20: 21:00:00 500 mg via ORAL
  Filled 2019-08-20: qty 2

## 2019-08-20 MED ORDER — METRONIDAZOLE 500 MG PO TABS: 500.0000 mg | ORAL_TABLET | Freq: Two times a day (BID) | ORAL | 0 refills | Status: DC

## 2019-08-20 MED ORDER — METRONIDAZOLE 500 MG PO TABS
500.0000 mg | ORAL_TABLET | Freq: Once | ORAL | Status: AC
  Administered 2019-08-20: 500 mg via ORAL
  Filled 2019-08-20: qty 1

## 2019-08-20 MED ORDER — CIPROFLOXACIN HCL 500 MG PO TABS: 500.0000 mg | ORAL_TABLET | Freq: Two times a day (BID) | ORAL | 0 refills | Status: DC

## 2019-08-20 NOTE — ED Notes (Signed)
Family updated as to patient's status.

## 2019-08-20 NOTE — ED Triage Notes (Signed)
Pain to RT groin for a couple of weeks that has progressively gotten worse.

## 2019-08-20 NOTE — Discharge Instructions (Signed)
As discussed, your evaluation today has been largely reassuring.  But, it is important that you monitor your condition carefully, and do not hesitate to return to the ED if you develop new, or concerning changes in your condition. ? ?Otherwise, please follow-up with your physician for appropriate ongoing care. ? ?

## 2019-08-20 NOTE — ED Provider Notes (Signed)
[facility] EMERGENCY DEPARTMENT Provider Note   CSN: [id number] Arrival date & time: 08/20/19  1651     History Chief Complaint  Patient presents with  . Groin Pain    Ryan Hansen is a 86 y.o. male.  HPI    Patient presents with concern of pain in the right lower quadrant, right inguinal area.  Onset was a few days ago, but now over the past day it has become more pronounced, persistent, sore, nonradiating. There is no associated fever, no vomiting, no urinary complaints, no scrotal pain, though there is some radiation to the right hemiscrotum. Patient denies similar prior events.  He notes that he is generally well, has been self for some time prior to this episode. It is unclear if he has taken any medication for relief, but it does seem as though with time the symptoms are worse, with rest, possibly better. Past Medical History:  Diagnosis Date  . Aortic stenosis   . Asthma   . BPH (benign prostatic hyperplasia)   . CAD (coronary artery disease)    catheterization with 60% DX1, less than 50% circumflex, 40% LRAS  . Elevated cholesterol   . GERD (gastroesophageal reflux disease)   . Hypertension   . Murmur 12/24/12    Patient Active Problem List   Diagnosis Date Noted  . Severe persistent asthma 12/07/2015  . Chronic rhinitis 12/07/2015  . CAD (coronary artery disease) 02/18/2014  . Hyperlipidemia 02/18/2014  . HEMORRHOIDS 09/22/2008  . GERD 09/22/2008  . ELEVATED PROSTATE SPECIFIC ANTIGEN 09/22/2008  . HEMATOCHEZIA, HX OF 09/22/2008    Past Surgical History:  Procedure Laterality Date  . CARDIAC CATHETERIZATION Left   . No previous surgery         Family History  Problem Relation Age of Onset  . Heart disease Other        No family history  . Allergic rhinitis Neg Hx   . Asthma Neg Hx   . Eczema Neg Hx   . Immunodeficiency Neg Hx   . Urticaria Neg Hx   . Angioedema Neg Hx     Social History   Tobacco Use  . Smoking status: Never Smoker  .  Smokeless tobacco: Never Used  Substance Use Topics  . Alcohol use: No  . Drug use: No    Home Medications Prior to Admission medications   Medication Sig Start Date End Date Taking? Authorizing Provider  aspirin EC 81 MG tablet Take 81 mg by mouth every morning.    [provider]  budesonide-formoterol (SYMBICORT) 160-4.5 MCG/ACT inhaler Use 2 puffs twice daily to prevent cough or wheeze. Rinse, gargle and spit after use. 12/07/15   Valentina Shaggy, MD  ciprofloxacin (CIPRO) 500 MG tablet Take 1 tablet (500 mg total) by mouth 2 (two) times daily. 08/20/19   Carmin Muskrat, MD  fluticasone [facility]) 50 MCG/ACT nasal spray Place 2 sprays into both nostrils daily. 12/07/15   Valentina Shaggy, MD  KRILL OIL PO Take 1 tablet by mouth every morning.    [provider]  metoprolol tartrate (LOPRESSOR) 25 MG tablet Take 0.5 tablets (12.5 mg total) by mouth 2 (two) times daily. 03/03/16   Arnoldo Lenis, MD  metoprolol tartrate (LOPRESSOR) 25 MG tablet TAKE ONE-HALF TABLET BY MOUTH TWICE A DAY 03/08/16   Arnoldo Lenis, MD  metroNIDAZOLE (FLAGYL) 500 MG tablet Take 1 tablet (500 mg total) by mouth 2 (two) times daily. 08/20/19   Carmin Muskrat, MD  mirtazapine (  REMERON) 15 MG tablet Take 3.75 mg by mouth at bedtime as needed.    [provider]  Multiple Vitamins-Minerals (OCUVITE ADULT 50+ PO) Take by mouth.    [provider]  omeprazole (PRILOSEC) 20 MG capsule Take 1 po BID x 2 weeks then once a day 07/22/16   Rolland Porter, MD  [facility] HFA 108 (90 BASE) MCG/ACT inhaler Inhale 1-2 puffs into the lungs every 6 (six) hours as needed for wheezing or shortness of breath. 03/02/15   Arnoldo Lenis, MD  simvastatin (ZOCOR) 40 MG tablet TAKE ONE (1) TABLET EACH DAY 06/21/15   Lelon Perla, MD  Spacer/Aero-Holding Chambers (AEROCHAMBER PLUS) inhaler Use as instructed 12/07/15   Valentina Shaggy, MD    Allergies    Procaine hcl  Review of  Systems   Review of Systems  Constitutional:       Per HPI, otherwise negative  HENT:       Per HPI, otherwise negative  Respiratory:       Per HPI, otherwise negative  Cardiovascular:       Per HPI, otherwise negative  Gastrointestinal: Negative for vomiting.  Endocrine:       Negative aside from HPI  Genitourinary:       Neg aside from HPI   Musculoskeletal:       Per HPI, otherwise negative  Skin: Negative.   Neurological: Negative for syncope.    Physical Exam Updated Vital Signs BP (!) 158/88 (BP Location: Right Arm)   Pulse 60   Temp 97.9 F (36.6 C) (Temporal)   Resp 18   Ht 6' (1.829 m)   Wt 79.4 kg   SpO2 95%   BMI 23.73 kg/m   Physical Exam Vitals and nursing note reviewed.  Constitutional:      General: He is not in acute distress.    Appearance: He is well-developed.  HENT:     Head: Normocephalic and atraumatic.  Eyes:     Conjunctiva/sclera: Conjunctivae normal.  Cardiovascular:     Rate and Rhythm: Normal rate and regular rhythm.  Pulmonary:     Effort: Pulmonary effort is normal. No respiratory distress.     Breath sounds: No stridor.  Abdominal:     General: There is no distension.     Tenderness: There is abdominal tenderness.     Comments: Tenderness without deformity along the right inguinal crease, right lower abdomen.  Genitourinary:    Penis: Normal.      Testes: Normal.     Comments: No scrotal tenderness palpation, no swelling, no discoloration.  No obvious hernia right or left side. Skin:    General: Skin is warm and dry.  Neurological:     Mental Status: He is alert and oriented to person, place, and time.     ED Results / Procedures / Treatments   Labs (all labs ordered are listed, but only abnormal results are displayed) Labs Reviewed  COMPREHENSIVE METABOLIC PANEL - Abnormal; Notable for the following components:      Result Value   Glucose, Bld 106 (*)    BUN 24 (*)    All other components within normal limits    URINALYSIS, ROUTINE W REFLEX MICROSCOPIC - Abnormal; Notable for the following components:   APPearance HAZY (*)    All other components within normal limits  CBC WITH DIFFERENTIAL/PLATELET    EKG None  Radiology CT Renal Stone Study  Result Date: 08/20/2019 CLINICAL DATA:  Right flank pain. EXAM: CT  ABDOMEN AND PELVIS WITHOUT CONTRAST TECHNIQUE: Multidetector CT imaging of the abdomen and pelvis was performed following the standard protocol without IV contrast. COMPARISON:  June 06, 2017 FINDINGS: Lower chest: Very mild areas of linear scarring and/or atelectasis are seen within the bilateral lung bases. Hepatobiliary: No focal liver abnormality is seen. No gallstones, gallbladder wall thickening, or biliary dilatation. Pancreas: Unremarkable. No pancreatic ductal dilatation or surrounding inflammatory changes. Spleen: Normal in size without focal abnormality. Adrenals/Urinary Tract: Adrenal glands are unremarkable. Kidneys are normal in size, without renal calculi or hydronephrosis. A 4 mm parenchymal calcification is seen within the lateral aspect of the mid right kidney. A small area of adjacent cortical scarring is seen. Bladder is unremarkable. Stomach/Bowel: Stomach is within normal limits. Appendix appears normal. No evidence of bowel dilatation there is moderate to marked severity thickening of the mid sigmoid colon. Numerous diverticula are seen throughout the large bowel. Vascular/Lymphatic: There is marked severity calcification of the abdominal aorta and bilateral common iliac arteries, without evidence of aneurysmal dilatation. No enlarged abdominal or pelvic lymph nodes. Reproductive: The prostate gland is moderately enlarged. Other: No abdominal wall hernia or abnormality. No abdominopelvic ascites. Musculoskeletal: Multilevel degenerative changes seen throughout the lumbar spine. IMPRESSION: 1. Moderate to marked severity thickening of the mid sigmoid colon, consistent with acute  diverticulitis. Correlation with follow-up colonoscopy is recommended to exclude the presence of an underlying neoplastic process. 2. Marked severity calcification of the abdominal aorta and bilateral common iliac arteries, without evidence of aneurysmal dilatation. 3. Moderate enlargement of the prostate gland. Aortic Atherosclerosis (ICD10-I70.0). Electronically Signed   By: Virgina Norfolk M.D.   On: 08/20/2019 19:47    Procedures Procedures (including critical care time)  Medications Ordered in ED Medications  ciprofloxacin (CIPRO) tablet 500 mg (has no administration in time range)  metroNIDAZOLE (FLAGYL) tablet 500 mg (has no administration in time range)    ED Course  I have reviewed the triage vital signs and the nursing notes.  Pertinent labs & imaging results that were available during my care of the patient were reviewed by me and considered in my medical decision making (see chart for details).    MDM Rules/Calculators/A&P                      9:08 PM Patient in no distress on repeat exam, awake, alert, sitting upright.  We discussed all findings, CT concerning for sigmoid diverticulitis, no abscess, no perforation.  Labs reassuring, vitals reassuring, no evidence for bacteremia, sepsis, no evidence for obstruction, patient amenable to, appropriate for discharge after initiation of antibiotics here. Final Clinical Impression(s) / ED Diagnoses Final diagnoses:  Sigmoid diverticulitis    Rx / DC Orders ED Discharge Orders         Ordered    metroNIDAZOLE (FLAGYL) 500 MG tablet  2 times daily     08/20/19 2107    ciprofloxacin (CIPRO) 500 MG tablet  2 times daily     08/20/19 2107           Carmin Muskrat, MD 08/20/19 2108

## 2021-05-04 ENCOUNTER — Emergency Department (HOSPITAL_COMMUNITY): Payer: Medicare PPO

## 2021-05-04 ENCOUNTER — Emergency Department (HOSPITAL_COMMUNITY)
Admission: EM | Admit: 2021-05-04 | Discharge: 2021-05-04 | Disposition: A | Discharge status: Home / Self Care | Payer: Medicare PPO | Attending: Emergency Medicine | Admitting: Emergency Medicine

## 2021-05-04 ENCOUNTER — Other Ambulatory Visit: Payer: Self-pay

## 2021-05-04 ENCOUNTER — Encounter (HOSPITAL_COMMUNITY): Payer: Self-pay

## 2021-05-04 DIAGNOSIS — Z7982 Long term (current) use of aspirin: Secondary | ICD-10-CM | POA: Diagnosis not present

## 2021-05-04 DIAGNOSIS — I10 Essential (primary) hypertension: Secondary | ICD-10-CM | POA: Diagnosis not present

## 2021-05-04 DIAGNOSIS — R1031 Right lower quadrant pain: Secondary | ICD-10-CM | POA: Diagnosis present

## 2021-05-04 DIAGNOSIS — I2510 Atherosclerotic heart disease of native coronary artery without angina pectoris: Secondary | ICD-10-CM | POA: Diagnosis not present

## 2021-05-04 DIAGNOSIS — Z79899 Other long term (current) drug therapy: Secondary | ICD-10-CM | POA: Insufficient documentation

## 2021-05-04 DIAGNOSIS — K219 Gastro-esophageal reflux disease without esophagitis: Secondary | ICD-10-CM | POA: Insufficient documentation

## 2021-05-04 DIAGNOSIS — K5792 Diverticulitis of intestine, part unspecified, without perforation or abscess without bleeding: Secondary | ICD-10-CM | POA: Insufficient documentation

## 2021-05-04 DIAGNOSIS — N50811 Right testicular pain: Secondary | ICD-10-CM | POA: Diagnosis not present

## 2021-05-04 LAB — CBC WITH DIFFERENTIAL/PLATELET
Abs Immature Granulocytes: 0.02 10*3/uL (ref 0.00–0.07)
Basophils Absolute: 0 10*3/uL (ref 0.0–0.1)
Basophils Relative: 1 %
Eosinophils Absolute: 0.3 10*3/uL (ref 0.0–0.5)
Eosinophils Relative: 4 %
HCT: 41.2 % (ref 39.0–52.0)
Hemoglobin: 13.5 g/dL (ref 13.0–17.0)
Immature Granulocytes: 0 %
Lymphocytes Relative: 21 %
Lymphs Abs: 1.7 10*3/uL (ref 0.7–4.0)
MCH: 29.1 pg (ref 26.0–34.0)
MCHC: 32.8 g/dL (ref 30.0–36.0)
MCV: 88.8 fL (ref 80.0–100.0)
Monocytes Absolute: 0.8 10*3/uL (ref 0.1–1.0)
Monocytes Relative: 10 %
Neutro Abs: 5.2 10*3/uL (ref 1.7–7.7)
Neutrophils Relative %: 64 %
Platelets: 244 10*3/uL (ref 150–400)
RBC: 4.64 MIL/uL (ref 4.22–5.81)
RDW: 13.6 % (ref 11.5–15.5)
WBC: 8 10*3/uL (ref 4.0–10.5)
nRBC: 0 % (ref 0.0–0.2)

## 2021-05-04 LAB — URINALYSIS, ROUTINE W REFLEX MICROSCOPIC
Bacteria, UA: NONE SEEN
Bilirubin Urine: NEGATIVE
Glucose, UA: NEGATIVE mg/dL
Ketones, ur: NEGATIVE mg/dL
Leukocytes,Ua: NEGATIVE
Nitrite: NEGATIVE
Protein, ur: NEGATIVE mg/dL
Specific Gravity, Urine: 1.023 (ref 1.005–1.030)
pH: 5 (ref 5.0–8.0)

## 2021-05-04 LAB — BASIC METABOLIC PANEL
Anion gap: 7 (ref 5–15)
BUN: 23 mg/dL (ref 8–23)
CO2: 26 mmol/L (ref 22–32)
Calcium: 8.9 mg/dL (ref 8.9–10.3)
Chloride: 104 mmol/L (ref 98–111)
Creatinine, Ser: 1.04 mg/dL (ref 0.61–1.24)
GFR, Estimated: >60 mL/min (ref >60)
Glucose, Bld: 105 mg/dL — ABNORMAL HIGH (ref 70–99)
Potassium: 4 mmol/L (ref 3.5–5.1)
Sodium: 137 mmol/L (ref 135–145)

## 2021-05-04 MED ORDER — IOHEXOL 300 MG/ML  SOLN
100.0000 mL | Freq: Once | INTRAMUSCULAR | Status: AC | PRN
  Administered 2021-05-04: 100 mL via INTRAVENOUS

## 2021-05-04 MED ORDER — AMOXICILLIN-POT CLAVULANATE 875-125 MG PO TABS: 1.0000 | ORAL_TABLET | Freq: Two times a day (BID) | ORAL | 0 refills | Status: AC

## 2021-05-04 MED ORDER — AMOXICILLIN-POT CLAVULANATE 875-125 MG PO TABS
1.0000 | ORAL_TABLET | Freq: Once | ORAL | Status: AC
  Administered 2021-05-04: 1 via ORAL
  Filled 2021-05-04: qty 1

## 2021-05-04 NOTE — Discharge Instructions (Signed)
CT showed some evidence of some mild inflammation in the in the colon known as diverticulitis.  There also could be a colonic mass.  Follow-up with your doctor Dr. Karie Kirks.  Also consider following up with gastroenterology to have a discussion whether colonoscopy is appropriate or not.  Take the antibiotic Augmentin as directed for 7 days.  Return for any new or worse symptoms.

## 2021-05-04 NOTE — ED Notes (Signed)
Transported to CT 

## 2021-05-04 NOTE — ED Notes (Signed)
Tammy PA at bedside to evaluate pt.

## 2021-05-04 NOTE — ED Triage Notes (Signed)
Pt presents to ED with sudden right testicle pain started today.

## 2021-05-04 NOTE — ED Notes (Signed)
Called PA to come evaluate pt.

## 2021-05-04 NOTE — ED Provider Triage Note (Signed)
Emergency Medicine Provider Triage Evaluation Note  Ryan Hansen , a 88 y.o. male  was evaluated in triage.  Pt complains of sudden onset of right testicle pain earlier today.  Pain radiates into his right groin.  He denies known injury, fever, chills, abdominal pain flank pain and dysuria.  Review of Systems  Positive: Right testicle pain. Negative: Dysuria, abdominal pain, fever chills  Physical Exam  BP (!) 196/84 (BP Location: Right Arm)    Pulse 66    Temp 97.8 F (36.6 C) (Oral)    Resp 18    Ht 6' (1.829 m)    Wt 68 kg    SpO2 95%    BMI 20.34 kg/m  Gen:   Awake, no distress   Resp:  Normal effort  MSK:   Moves extremities without difficulty  Other:  Tenderness to palpation of the right testicle and epididymis.  No appreciable edema  Medical Decision Making  Medically screening exam initiated at 5:26 PM.  Appropriate orders placed.  Ryan Hansen was informed that the remainder of the evaluation will be completed by another provider, this initial triage assessment does not replace that evaluation, and the importance of remaining in the ED until their evaluation is complete.  Patient here for evaluation of right testicle pain of onset today.  No reported injury pain radiates to his right groin.  He denies abdominal pain fever vomiting and dysuria.  History of BPH.  Patient will need further evaluation in the emergency department and ultrasound imaging of the testicle.  Patient agreeable to plan.   Kem Parkinson, PA-C 05/04/21 1729

## 2021-05-04 NOTE — ED Provider Notes (Signed)
[facility] EMERGENCY DEPARTMENT Provider Note   CSN: [id number] Arrival date & time: 05/04/21  1640     History  Chief Complaint  Patient presents with   Testicle Pain    Ryan Hansen is a 88 y.o. male.  Patient states that he has had right lower quadrant abdominal pain radiating down into his groin and right testicle area for unknown period of time.  Patient is 88 years old and lives by himself.  Past medical history sniffing for elevated cholesterol hypertension aortic stenosis by benign prostatic hyperplasia coronary artery disease and gastroesophageal reflux disease.  Patient denies any nausea or vomiting.  Denies any difficulty urinating.  Patient denies any injury.      Home Medications Prior to Admission medications   Medication Sig Start Date End Date Taking? Authorizing Provider  amoxicillin-clavulanate (AUGMENTIN) 875-125 MG tablet Take 1 tablet by mouth every 12 (twelve) hours for 7 days. 05/04/21 05/11/21 Yes Fredia Sorrow, MD  budesonide-formoterol [facility]) 160-4.5 MCG/ACT inhaler Use 2 puffs twice daily to prevent cough or wheeze. Rinse, gargle and spit after use. 12/07/15  Yes Valentina Shaggy, MD  finasteride (PROSCAR) 5 MG tablet Take 5 mg by mouth daily. 01/08/21  Yes [provider]  fluticasone (FLONASE) 50 MCG/ACT nasal spray Place 2 sprays into both nostrils daily. 12/07/15  Yes Valentina Shaggy, MD  KRILL OIL PO Take 1 tablet by mouth every morning.   Yes [provider]  metoprolol succinate (TOPROL-XL) 25 MG 24 hr tablet Take 25 mg by mouth every morning. 01/08/21  Yes [provider]  metoprolol tartrate (LOPRESSOR) 25 MG tablet TAKE ONE-HALF TABLET BY MOUTH TWICE A DAY 03/08/16  Yes Branch, Alphonse Guild, MD  mirtazapine (REMERON) 15 MG tablet Take 3.75 mg by mouth at bedtime as needed.   Yes [provider]  PROAIR HFA 108 (90 BASE) MCG/ACT inhaler Inhale 1-2 puffs into the lungs every 6 (six) hours as needed  for wheezing or shortness of breath. 03/02/15  Yes Arnoldo Lenis, MD  simvastatin (ZOCOR) 40 MG tablet TAKE ONE (1) TABLET EACH DAY Patient taking differently: Take 40 mg by mouth daily. 06/21/15  Yes Lelon Perla, MD  SYMBICORT 80-4.5 MCG/ACT inhaler Inhale 2 puffs into the lungs 2 (two) times daily. 04/01/21  Yes [provider]  amoxicillin (AMOXIL) 500 MG capsule Take 500 mg by mouth 3 (three) times daily. Patient not taking: Reported on 05/04/2021 11/30/20   [provider]  aspirin EC 81 MG tablet Take 81 mg by mouth every morning. Patient not taking: Reported on 05/04/2021    [provider]  ciprofloxacin (CIPRO) 500 MG tablet Take 1 tablet (500 mg total) by mouth 2 (two) times daily. Patient not taking: Reported on 05/04/2021 08/20/19   Carmin Muskrat, MD  metoprolol tartrate (LOPRESSOR) 25 MG tablet Take 0.5 tablets (12.5 mg total) by mouth 2 (two) times daily. Patient not taking: Reported on 05/04/2021 03/03/16   Arnoldo Lenis, MD  metroNIDAZOLE (FLAGYL) 500 MG tablet Take 1 tablet (500 mg total) by mouth 2 (two) times daily. Patient not taking: Reported on 05/04/2021 08/20/19   Carmin Muskrat, MD  Multiple Vitamins-Minerals [facility] ADULT 50+ PO) Take by mouth. Patient not taking: Reported on 05/04/2021    [provider]  omeprazole (PRILOSEC) 20 MG capsule Take 1 po BID x 2 weeks then once a day Patient not taking: Reported on 05/04/2021 07/22/16   Rolland Porter, MD  Spacer/Aero-Holding Chambers (AEROCHAMBER PLUS) inhaler Use  as instructed 12/07/15   Valentina Shaggy, MD      Allergies    Procaine hcl    Review of Systems   Review of Systems  Constitutional:  Negative for chills and fever.  HENT:  Negative for ear pain and sore throat.   Eyes:  Negative for pain and visual disturbance.  Respiratory:  Negative for cough and shortness of breath.   Cardiovascular:  Negative for chest pain and palpitations.  Gastrointestinal:  Positive  for abdominal pain. Negative for vomiting.  Genitourinary:  Positive for testicular pain. Negative for difficulty urinating, dysuria and hematuria.  Musculoskeletal:  Negative for arthralgias and back pain.  Skin:  Negative for color change and rash.  Neurological:  Negative for seizures and syncope.  All other systems reviewed and are negative.  Physical Exam Updated Vital Signs BP (!) 183/107    Pulse 63    Temp 97.8 F (36.6 C) (Oral)    Resp 18    Ht 1.829 m (6')    Wt 68 kg    SpO2 97%    BMI 20.34 kg/m  Physical Exam Vitals and nursing note reviewed.  Constitutional:      General: He is not in acute distress.    Appearance: Normal appearance. He is well-developed. He is not toxic-appearing.  HENT:     Head: Normocephalic and atraumatic.  Eyes:     Extraocular Movements: Extraocular movements intact.     Conjunctiva/sclera: Conjunctivae normal.     Pupils: Pupils are equal, round, and reactive to light.  Cardiovascular:     Rate and Rhythm: Normal rate and regular rhythm.     Heart sounds: No murmur heard. Pulmonary:     Effort: Pulmonary effort is normal. No respiratory distress.     Breath sounds: Normal breath sounds.  Abdominal:     Palpations: Abdomen is soft.     Tenderness: There is abdominal tenderness.     Comments: Tenderness to palpation to right lower quadrant  Genitourinary:    Penis: Normal.      Comments: Patient is uncircumcised.  Testicles nontender epididymis nontender bilaterally.  No scrotal swelling.  No evidence of any inguinal hernia.  There is some tenderness to palpation right lower quadrant of the abdomen Musculoskeletal:        General: No swelling. Normal range of motion.     Cervical back: Normal range of motion and neck supple.  Skin:    General: Skin is warm and dry.     Capillary Refill: Capillary refill takes less than 2 seconds.  Neurological:     General: No focal deficit present.     Mental Status: He is alert and oriented to  person, place, and time.     Cranial Nerves: No cranial nerve deficit.     Sensory: No sensory deficit.     Motor: No weakness.  Psychiatric:        Mood and Affect: Mood normal.    ED Results / Procedures / Treatments   Labs (all labs ordered are listed, but only abnormal results are displayed) Labs Reviewed  URINALYSIS, ROUTINE W REFLEX MICROSCOPIC - Abnormal; Notable for the following components:      Result Value   Hgb urine dipstick SMALL (*)    All other components within normal limits  BASIC METABOLIC PANEL - Abnormal; Notable for the following components:   Glucose, Bld 105 (*)    All other components within normal limits  CBC WITH DIFFERENTIAL/PLATELET  EKG None  Radiology CT Abdomen Pelvis W Contrast  Result Date: 05/04/2021 CLINICAL DATA:  Right lower quadrant abdominal pain. EXAM: CT ABDOMEN AND PELVIS WITH CONTRAST TECHNIQUE: Multidetector CT imaging of the abdomen and pelvis was performed using the standard protocol following bolus administration of intravenous contrast. CONTRAST:  157mL OMNIPAQUE IOHEXOL 300 MG/ML  SOLN COMPARISON:  Scrotal ultrasound 05/04/2021. CT renal stone 08/20/2019. FINDINGS: Lower chest: There is minimal atelectasis or scarring in the lung bases. Hepatobiliary: Stable subcentimeter hypodensity in the right lobe of the liver image 2/14, possibly a cyst or hemangioma. No new liver lesions are seen. Gallbladder and bile ducts are within normal limits. Pancreas: Unremarkable. No pancreatic ductal dilatation or surrounding inflammatory changes. Spleen: Normal in size without focal abnormality. Adrenals/Urinary Tract: Adrenal glands are unremarkable. Kidneys are normal, without renal calculi, focal lesion, or hydronephrosis. Bladder is unremarkable. Stomach/Bowel: There is diffuse colonic diverticulosis. There is focal wall thickening of the mid sigmoid colon without significant surrounding inflammation; however, there is a small amount of infiltration  of fat at this level. There is diffuse colonic diverticulosis. There is no bowel obstruction. The appendix, small bowel and stomach are within normal limits. Vascular/Lymphatic: Aortic atherosclerosis. No enlarged abdominal or pelvic lymph nodes. Reproductive: Prostate gland is enlarged measuring 5.4 by 3.8 cm. Other: No ascites or free air. There are small fat containing inguinal hernias. Right lateral abdominal wall intramuscular lipoma appears unchanged from the prior examination. Musculoskeletal: Multilevel degenerative changes affect the spine. IMPRESSION: 1. Diffuse colonic diverticulosis. There is focal wall thickening of the sigmoid colon with minimal infiltration of the fat at this level. Findings may be related to colonic mass. Mild acute diverticulitis can not be excluded. Recommend correlation with colonoscopy. 2. Appendix within normal limits. 3. Prostatomegaly. 4.  Aortic Atherosclerosis (ICD10-I70.0). Electronically Signed   By: Ronney Asters M.D.   On: 05/04/2021 22:00   US SCROTUM DOPPLER  Result Date: 05/04/2021 CLINICAL DATA:  Right testicle pain EXAM: SCROTAL ULTRASOUND DOPPLER ULTRASOUND OF THE TESTICLES TECHNIQUE: Complete ultrasound examination of the testicles, epididymis, and other scrotal structures was performed. Color and spectral Doppler ultrasound were also utilized to evaluate blood flow to the testicles. COMPARISON:  None. FINDINGS: Right testicle Measurements: 4.1 x 2.9 x 2.8 cm. No mass or microlithiasis visualized. Left testicle Measurements: 4.3 x 2.3 x 3 cm. No mass or microlithiasis visualized. Right epididymis:  Cyst or spermatocele measuring 18 mm. Left epididymis:  Normal in size and appearance. Hydrocele:  Small bilateral hydroceles. Varicocele:  None visualized. Pulsed Doppler interrogation of both testes demonstrates normal low resistance arterial and venous waveforms bilaterally. IMPRESSION: 1. Negative for testicular torsion. 2. Small bilateral hydroceles 3. 18 mm  epididymal cyst or spermatocele Electronically Signed   By: Donavan Foil M.D.   On: 05/04/2021 17:47    Procedures Procedures    Medications Ordered in ED Medications  amoxicillin-clavulanate (AUGMENTIN) 875-125 MG per tablet 1 tablet (has no administration in time range)  iohexol (OMNIPAQUE) 300 MG/ML solution 100 mL (100 mLs Intravenous Contrast Given 05/04/21 2141)    ED Course/ Medical Decision Making/ A&P                           Medical Decision Making  Patient's work-up urinalysis negative no acute findings.  No leukocytosis.  Hemoglobin is normal.  Basic metabolic panel is normal renal functions normal.  Ultrasound of the scrotum negative for testicular torsion small bilateral hydroceles.  An 18 mm epididymal  cyst or spermatocele.  That is on the right side.  Patient's testicles exam are nontender.  No epididymis tender.  No swelling.  No evidence of any groin hernias.  Does have some tenderness to the right lower quadrant.  Will get CT scan abdomen pelvis to further evaluate.  To rule out any intra-abdominal process.  And also to get a better read on the potential for any hernia.  No evidence really consistent with epididymitis no evidence of urinary tract infection.  No evidence of testicular torsion.  On a clinical exam no evidence of any hernia.  CT scan shows evidence of some mild diverticulitis.  There may also be a colonic mass.  But no other significant findings.  We will go ahead and treat with Augmentin.  Have him follow-up with primary care doctor.  Also given a referral to gastroenterology if he wants to talk to them about the pros and cons of doing a colonoscopy to rule out colonic mass.  No evidence of hernia.  No evidence of epididymitis no evidence of urinary tract infection.  No evidence of any type of scrotal infection.    Final Clinical Impression(s) / ED Diagnoses Final diagnoses:  Pain in right testicle  Right lower quadrant abdominal pain  Diverticulitis     Rx / DC Orders ED Discharge Orders          Ordered    amoxicillin-clavulanate (AUGMENTIN) 875-125 MG tablet  Every 12 hours        05/04/21 2234              Fredia Sorrow, MD 05/04/21 2235

## 2021-05-04 NOTE — ED Notes (Signed)
[place] called and made aware of pt order for [place].

## 2021-06-16 ENCOUNTER — Other Ambulatory Visit: Payer: Self-pay

## 2021-06-16 ENCOUNTER — Encounter: Payer: Self-pay | Admitting: Emergency Medicine

## 2021-06-16 ENCOUNTER — Ambulatory Visit
Admission: EM | Admit: 2021-06-16 | Discharge: 2021-06-16 | Disposition: A | Discharge status: Home / Self Care | Payer: Medicare PPO | Attending: Urgent Care | Admitting: Urgent Care

## 2021-06-16 DIAGNOSIS — R0982 Postnasal drip: Secondary | ICD-10-CM

## 2021-06-16 DIAGNOSIS — J310 Chronic rhinitis: Secondary | ICD-10-CM

## 2021-06-16 DIAGNOSIS — R110 Nausea: Secondary | ICD-10-CM | POA: Diagnosis not present

## 2021-06-16 DIAGNOSIS — K117 Disturbances of salivary secretion: Secondary | ICD-10-CM | POA: Diagnosis not present

## 2021-06-16 MED ORDER — GLYCOPYRROLATE 1 MG/5ML PO SOLN: 1.0000 mg | Freq: Two times a day (BID) | ORAL | 0 refills | Status: DC | PRN

## 2021-06-16 MED ORDER — CETIRIZINE HCL 5 MG PO TABS: 5.0000 mg | ORAL_TABLET | Freq: Every day | ORAL | 0 refills | Status: AC

## 2021-06-16 MED ORDER — ONDANSETRON 4 MG PO TBDP: 4.0000 mg | ORAL_TABLET | Freq: Three times a day (TID) | ORAL | 0 refills | Status: DC | PRN

## 2021-06-16 NOTE — Discharge Instructions (Addendum)
Please make sure you follow up with your regular doctor to see how the symptoms are doing. In the meantime, take glycopyrrolate twice daily as needed to help with the excessive saliva. Use Zyrtec to help with the post-nasal drainage as a possible source of your symptoms. Use ondansetron dissolvable tablet under the tongue to help with the symptoms of nausea. If you develop chest pain, a bad headache, abdominal pain, throat pain then please come back to a recheck or go to the emergency room if your symptoms are severe.

## 2021-06-16 NOTE — ED Provider Notes (Signed)
[facility]   MRN: [id number] DOB: 15-Apr-1929  Subjective:   Ryan Hansen is a 88 y.o. male presenting for 2-day history of acute onset persistent excessive salivation, nausea, drainage.  No headache, confusion, fever, throat pain, painful swallowing, chest pain, shortness of breath, heart racing, wheezing, vomiting, abdominal pain, facial pain, oral pain.  No history of neurologic disorders, Parkinson's.  No history of salivary stones.  Patient takes Symbicort for his asthma.  He also has chronic rhinitis but does not take anything consistently for this.  No current facility-administered medications for this encounter.  Current Outpatient Medications:    amoxicillin (AMOXIL) 500 MG capsule, Take 500 mg by mouth 3 (three) times daily. (Patient not taking: Reported on 05/04/2021), Disp: , Rfl:    aspirin EC 81 MG tablet, Take 81 mg by mouth every morning. (Patient not taking: Reported on 05/04/2021), Disp: , Rfl:    budesonide-formoterol (SYMBICORT) 160-4.5 MCG/ACT inhaler, Use 2 puffs twice daily to prevent cough or wheeze. Rinse, gargle and spit after use., Disp: 1 Inhaler, Rfl: 5   ciprofloxacin (CIPRO) 500 MG tablet, Take 1 tablet (500 mg total) by mouth 2 (two) times daily. (Patient not taking: Reported on 05/04/2021), Disp: 14 tablet, Rfl: 0   finasteride (PROSCAR) 5 MG tablet, Take 5 mg by mouth daily., Disp: , Rfl:    fluticasone (FLONASE) 50 MCG/ACT nasal spray, Place 2 sprays into both nostrils daily., Disp: 16 g, Rfl: 5   KRILL OIL PO, Take 1 tablet by mouth every morning., Disp: , Rfl:    metoprolol succinate (TOPROL-XL) 25 MG 24 hr tablet, Take 25 mg by mouth every morning., Disp: , Rfl:    metoprolol tartrate (LOPRESSOR) 25 MG tablet, Take 0.5 tablets (12.5 mg total) by mouth 2 (two) times daily. (Patient not taking: Reported on 05/04/2021), Disp: 15 tablet, Rfl: 0   metoprolol tartrate (LOPRESSOR) 25 MG tablet, TAKE ONE-HALF TABLET BY MOUTH TWICE A DAY, Disp: 30  tablet, Rfl: 2   metroNIDAZOLE (FLAGYL) 500 MG tablet, Take 1 tablet (500 mg total) by mouth 2 (two) times daily. (Patient not taking: Reported on 05/04/2021), Disp: 14 tablet, Rfl: 0   mirtazapine (REMERON) 15 MG tablet, Take 3.75 mg by mouth at bedtime as needed., Disp: , Rfl:    Multiple Vitamins-Minerals (OCUVITE ADULT 50+ PO), Take by mouth. (Patient not taking: Reported on 05/04/2021), Disp: , Rfl:    omeprazole (PRILOSEC) 20 MG capsule, Take 1 po BID x 2 weeks then once a day (Patient not taking: Reported on 05/04/2021), Disp: 60 capsule, Rfl: 0   PROAIR HFA 108 (90 BASE) MCG/ACT inhaler, Inhale 1-2 puffs into the lungs every 6 (six) hours as needed for wheezing or shortness of breath., Disp: 3.7 g, Rfl: 1   simvastatin (ZOCOR) 40 MG tablet, TAKE ONE (1) TABLET EACH DAY (Patient taking differently: Take 40 mg by mouth daily.), Disp: 30 tablet, Rfl: 5   Spacer/Aero-Holding Chambers (AEROCHAMBER PLUS) inhaler, Use as instructed, Disp: 1 each, Rfl: 2   SYMBICORT 80-4.5 MCG/ACT inhaler, Inhale 2 puffs into the lungs 2 (two) times daily., Disp: , Rfl:    Allergies  Allergen Reactions   Procaine Hcl     REACTION: ANXIOUSNESS AND NERVOUSNESS    Past Medical History:  Diagnosis Date   Aortic stenosis    Asthma    BPH (benign prostatic hyperplasia)    CAD (coronary artery disease)    catheterization with 60% DX1, less than 50% circumflex, 40% LRAS   Elevated cholesterol  GERD (gastroesophageal reflux disease)    Hypertension    Murmur 12/24/12     Past Surgical History:  Procedure Laterality Date   CARDIAC CATHETERIZATION Left    No previous surgery      Family History  Problem Relation Age of Onset   Heart disease Other        No family history   Allergic rhinitis Neg Hx    Asthma Neg Hx    Eczema Neg Hx    Immunodeficiency Neg Hx    Urticaria Neg Hx    Angioedema Neg Hx     Social History   Tobacco Use   Smoking status: Never   Smokeless tobacco: Never  Substance Use  Topics   Alcohol use: No   Drug use: No    ROS   Objective:   Vitals: BP (!) 163/73 (BP Location: Right Arm)    Pulse 70    Temp 98.3 F (36.8 C) (Oral)    Resp 18    SpO2 93%   Physical Exam Constitutional:      General: He is not in acute distress.    Appearance: Normal appearance. He is well-developed and normal weight. He is not ill-appearing, toxic-appearing or diaphoretic.  HENT:     Head: Normocephalic and atraumatic.     Right Ear: Tympanic membrane, ear canal and external ear normal. There is no impacted cerumen.     Left Ear: Tympanic membrane, ear canal and external ear normal. There is no impacted cerumen.     Nose: Nose normal. No congestion or rhinorrhea.     Mouth/Throat:     Mouth: Mucous membranes are moist.     Pharynx: No pharyngeal swelling, oropharyngeal exudate, posterior oropharyngeal erythema or uvula swelling.     Tonsils: No tonsillar exudate or tonsillar abscesses. 0 on the right. 0 on the left.     Comments: No geographic tongue.  Significant postnasal drainage and cobblestone pattern overlying pharynx. Eyes:     General: No scleral icterus.       Right eye: No discharge.        Left eye: No discharge.     Extraocular Movements: Extraocular movements intact.     Conjunctiva/sclera: Conjunctivae normal.  Cardiovascular:     Rate and Rhythm: Normal rate and regular rhythm.     Heart sounds: Normal heart sounds. No murmur heard.   No friction rub. No gallop.  Pulmonary:     Effort: Pulmonary effort is normal. No respiratory distress.     Breath sounds: Normal breath sounds. No stridor. No wheezing, rhonchi or rales.  Musculoskeletal:     Cervical back: Normal range of motion and neck supple. No rigidity. No muscular tenderness.  Neurological:     General: No focal deficit present.     Mental Status: He is alert and oriented to person, place, and time.  Psychiatric:        Mood and Affect: Mood normal.        Behavior: Behavior normal.         Thought Content: Thought content normal.        Judgment: Judgment normal.     Assessment and Plan :   PDMP not reviewed this encounter.  1. Sialorrhea   2. Post-nasal drainage   3. Nausea without vomiting   4. Chronic rhinitis    Undifferentiated sialorrhea.  However, physical exam findings are reassuring.  He is not on any particular medication that I would expect to be causing  this.  His exam does not suggest oral thrush, pharyngitis, sialolithiasis.  His medical history is inconsistent with anything that would cause sialorrhea.  For now, recommended COVID-19 testing, supportive care.  Emphasized need for close follow-up with his PCP. Counseled patient on potential for adverse effects with medications prescribed/recommended today, ER and return-to-clinic precautions discussed, patient verbalized understanding.    Jaynee Eagles, PA-C 06/16/21 1521

## 2021-06-16 NOTE — ED Triage Notes (Signed)
States he is having excessive salivation that is making him nauseated x a couple of days.

## 2021-06-17 ENCOUNTER — Other Ambulatory Visit: Payer: Self-pay

## 2021-06-17 ENCOUNTER — Emergency Department (HOSPITAL_COMMUNITY): Payer: Medicare Other

## 2021-06-17 ENCOUNTER — Inpatient Hospital Stay (HOSPITAL_COMMUNITY)
Admission: EM | Admit: 2021-06-17 | Discharge: 2021-06-19 | DRG: 392 | Disposition: A | Discharge status: Home / Self Care | Payer: Medicare Other | Attending: Family Medicine | Admitting: Family Medicine

## 2021-06-17 ENCOUNTER — Encounter (HOSPITAL_COMMUNITY): Payer: Self-pay

## 2021-06-17 DIAGNOSIS — N400 Enlarged prostate without lower urinary tract symptoms: Secondary | ICD-10-CM | POA: Diagnosis not present

## 2021-06-17 DIAGNOSIS — I2583 Coronary atherosclerosis due to lipid rich plaque: Secondary | ICD-10-CM

## 2021-06-17 DIAGNOSIS — K224 Dyskinesia of esophagus: Secondary | ICD-10-CM | POA: Diagnosis present

## 2021-06-17 DIAGNOSIS — F0390 Unspecified dementia without behavioral disturbance: Secondary | ICD-10-CM | POA: Diagnosis not present

## 2021-06-17 DIAGNOSIS — K117 Disturbances of salivary secretion: Secondary | ICD-10-CM | POA: Diagnosis present

## 2021-06-17 DIAGNOSIS — I10 Essential (primary) hypertension: Secondary | ICD-10-CM | POA: Diagnosis not present

## 2021-06-17 DIAGNOSIS — D649 Anemia, unspecified: Secondary | ICD-10-CM | POA: Diagnosis present

## 2021-06-17 DIAGNOSIS — K219 Gastro-esophageal reflux disease without esophagitis: Secondary | ICD-10-CM | POA: Diagnosis not present

## 2021-06-17 DIAGNOSIS — R5381 Other malaise: Secondary | ICD-10-CM

## 2021-06-17 DIAGNOSIS — D131 Benign neoplasm of stomach: Secondary | ICD-10-CM | POA: Diagnosis not present

## 2021-06-17 DIAGNOSIS — T18128A Food in esophagus causing other injury, initial encounter: Secondary | ICD-10-CM | POA: Diagnosis not present

## 2021-06-17 DIAGNOSIS — D329 Benign neoplasm of meninges, unspecified: Secondary | ICD-10-CM | POA: Diagnosis not present

## 2021-06-17 DIAGNOSIS — Z20822 Contact with and (suspected) exposure to covid-19: Secondary | ICD-10-CM | POA: Diagnosis not present

## 2021-06-17 DIAGNOSIS — R933 Abnormal findings on diagnostic imaging of other parts of digestive tract: Secondary | ICD-10-CM | POA: Diagnosis not present

## 2021-06-17 DIAGNOSIS — K2980 Duodenitis without bleeding: Secondary | ICD-10-CM | POA: Diagnosis not present

## 2021-06-17 DIAGNOSIS — R609 Edema, unspecified: Secondary | ICD-10-CM | POA: Diagnosis not present

## 2021-06-17 DIAGNOSIS — K222 Esophageal obstruction: Secondary | ICD-10-CM | POA: Diagnosis present

## 2021-06-17 DIAGNOSIS — E7800 Pure hypercholesterolemia, unspecified: Secondary | ICD-10-CM | POA: Diagnosis present

## 2021-06-17 DIAGNOSIS — R112 Nausea with vomiting, unspecified: Secondary | ICD-10-CM

## 2021-06-17 DIAGNOSIS — Z79899 Other long term (current) drug therapy: Secondary | ICD-10-CM

## 2021-06-17 DIAGNOSIS — R1314 Dysphagia, pharyngoesophageal phase: Principal | ICD-10-CM | POA: Diagnosis present

## 2021-06-17 DIAGNOSIS — I2510 Atherosclerotic heart disease of native coronary artery without angina pectoris: Secondary | ICD-10-CM | POA: Diagnosis not present

## 2021-06-17 DIAGNOSIS — K449 Diaphragmatic hernia without obstruction or gangrene: Secondary | ICD-10-CM | POA: Diagnosis present

## 2021-06-17 DIAGNOSIS — Z7951 Long term (current) use of inhaled steroids: Secondary | ICD-10-CM

## 2021-06-17 DIAGNOSIS — X58XXXA Exposure to other specified factors, initial encounter: Secondary | ICD-10-CM | POA: Diagnosis not present

## 2021-06-17 DIAGNOSIS — K2289 Other specified disease of esophagus: Secondary | ICD-10-CM | POA: Diagnosis not present

## 2021-06-17 DIAGNOSIS — J45909 Unspecified asthma, uncomplicated: Secondary | ICD-10-CM | POA: Diagnosis present

## 2021-06-17 DIAGNOSIS — I350 Nonrheumatic aortic (valve) stenosis: Secondary | ICD-10-CM | POA: Diagnosis present

## 2021-06-17 DIAGNOSIS — R011 Cardiac murmur, unspecified: Secondary | ICD-10-CM | POA: Diagnosis present

## 2021-06-17 DIAGNOSIS — R1310 Dysphagia, unspecified: Secondary | ICD-10-CM

## 2021-06-17 DIAGNOSIS — K269 Duodenal ulcer, unspecified as acute or chronic, without hemorrhage or perforation: Secondary | ICD-10-CM | POA: Diagnosis not present

## 2021-06-17 DIAGNOSIS — K2970 Gastritis, unspecified, without bleeding: Secondary | ICD-10-CM | POA: Diagnosis not present

## 2021-06-17 DIAGNOSIS — Z884 Allergy status to anesthetic agent status: Secondary | ICD-10-CM

## 2021-06-17 LAB — COMPREHENSIVE METABOLIC PANEL
ALT: 13 U/L (ref 0–44)
AST: 15 U/L (ref 15–41)
Albumin: 4.1 g/dL (ref 3.5–5.0)
Alkaline Phosphatase: 60 U/L (ref 38–126)
Anion gap: 8 (ref 5–15)
BUN: 27 mg/dL — ABNORMAL HIGH (ref 8–23)
CO2: 26 mmol/L (ref 22–32)
Calcium: 9.1 mg/dL (ref 8.9–10.3)
Chloride: 106 mmol/L (ref 98–111)
Creatinine, Ser: 1.12 mg/dL (ref 0.61–1.24)
GFR, Estimated: >60 mL/min (ref >60)
Glucose, Bld: 90 mg/dL (ref 70–99)
Potassium: 4 mmol/L (ref 3.5–5.1)
Sodium: 140 mmol/L (ref 135–145)
Total Bilirubin: 0.9 mg/dL (ref 0.3–1.2)
Total Protein: 8.7 g/dL — ABNORMAL HIGH (ref 6.5–8.1)

## 2021-06-17 LAB — RAPID URINE DRUG SCREEN, HOSP PERFORMED
Amphetamines: NOT DETECTED
Barbiturates: NOT DETECTED
Benzodiazepines: NOT DETECTED
Cocaine: NOT DETECTED
Opiates: NOT DETECTED
Tetrahydrocannabinol: NOT DETECTED

## 2021-06-17 LAB — DIFFERENTIAL
Abs Immature Granulocytes: 0.04 10*3/uL (ref 0.00–0.07)
Basophils Absolute: 0 10*3/uL (ref 0.0–0.1)
Basophils Relative: 0 %
Eosinophils Absolute: 0.2 10*3/uL (ref 0.0–0.5)
Eosinophils Relative: 2 %
Immature Granulocytes: 0 %
Lymphocytes Relative: 14 %
Lymphs Abs: 1.3 10*3/uL (ref 0.7–4.0)
Monocytes Absolute: 0.9 10*3/uL (ref 0.1–1.0)
Monocytes Relative: 9 %
Neutro Abs: 6.9 10*3/uL (ref 1.7–7.7)
Neutrophils Relative %: 75 %

## 2021-06-17 LAB — CBC
HCT: 44.6 % (ref 39.0–52.0)
Hemoglobin: 14.4 g/dL (ref 13.0–17.0)
MCH: 28.6 pg (ref 26.0–34.0)
MCHC: 32.3 g/dL (ref 30.0–36.0)
MCV: 88.5 fL (ref 80.0–100.0)
Platelets: 274 10*3/uL (ref 150–400)
RBC: 5.04 MIL/uL (ref 4.22–5.81)
RDW: 13.8 % (ref 11.5–15.5)
WBC: 9.3 10*3/uL (ref 4.0–10.5)
nRBC: 0 % (ref 0.0–0.2)

## 2021-06-17 LAB — URINALYSIS, ROUTINE W REFLEX MICROSCOPIC
Bilirubin Urine: NEGATIVE
Glucose, UA: NEGATIVE mg/dL
Ketones, ur: 15 mg/dL — AB
Leukocytes,Ua: NEGATIVE
Nitrite: NEGATIVE
Protein, ur: NEGATIVE mg/dL
Specific Gravity, Urine: 1.015 (ref 1.005–1.030)
pH: 5 (ref 5.0–8.0)

## 2021-06-17 LAB — I-STAT CHEM 8, ED
BUN: 26 mg/dL — ABNORMAL HIGH (ref 8–23)
Calcium, Ion: 1.15 mmol/L (ref 1.15–1.40)
Chloride: 107 mmol/L (ref 98–111)
Creatinine, Ser: 1 mg/dL (ref 0.61–1.24)
Glucose, Bld: 92 mg/dL (ref 70–99)
HCT: 44 % (ref 39.0–52.0)
Hemoglobin: 15 g/dL (ref 13.0–17.0)
Potassium: 4 mmol/L (ref 3.5–5.1)
Sodium: 142 mmol/L (ref 135–145)
TCO2: 27 mmol/L (ref 22–32)

## 2021-06-17 LAB — APTT: aPTT: 33 seconds (ref 24–36)

## 2021-06-17 LAB — URINALYSIS, MICROSCOPIC (REFLEX)
Bacteria, UA: NONE SEEN
Squamous Epithelial / HPF: NONE SEEN (ref 0–5)
WBC, UA: NONE SEEN WBC/hpf (ref 0–5)

## 2021-06-17 LAB — NOVEL CORONAVIRUS, NAA: SARS-CoV-2, NAA: NOT DETECTED

## 2021-06-17 LAB — RESP PANEL BY RT-PCR (FLU A&B, COVID) ARPGX2
Influenza A by PCR: NEGATIVE
Influenza B by PCR: NEGATIVE
SARS Coronavirus 2 by RT PCR: NEGATIVE

## 2021-06-17 LAB — PROTIME-INR
INR: 1 (ref 0.8–1.2)
Prothrombin Time: 13.1 seconds (ref 11.4–15.2)

## 2021-06-17 LAB — CBG MONITORING, ED: Glucose-Capillary: 91 mg/dL (ref 70–99)

## 2021-06-17 LAB — ETHANOL: Alcohol, Ethyl (B): <10 mg/dL (ref <10)

## 2021-06-17 MED ORDER — ACETAMINOPHEN 650 MG RE SUPP: 650.0000 mg | Freq: Four times a day (QID) | RECTAL | Status: DC | PRN

## 2021-06-17 MED ORDER — DEXTROSE-NACL 5-0.45 % IV SOLN: INTRAVENOUS | Status: DC

## 2021-06-17 MED ORDER — SODIUM CHLORIDE 0.9 % IV SOLN: 250.0000 mL | INTRAVENOUS | Status: DC | PRN

## 2021-06-17 MED ORDER — BISACODYL 10 MG RE SUPP: 10.0000 mg | Freq: Every day | RECTAL | Status: DC | PRN

## 2021-06-17 MED ORDER — SODIUM CHLORIDE 0.9% FLUSH: 3.0000 mL | INTRAVENOUS | Status: DC | PRN

## 2021-06-17 MED ORDER — GLUCAGON HCL RDNA (DIAGNOSTIC) 1 MG IJ SOLR
1.0000 mg | Freq: Once | INTRAMUSCULAR | Status: AC
  Administered 2021-06-17: 1 mg via INTRAVENOUS
  Filled 2021-06-17: qty 1

## 2021-06-17 MED ORDER — MOMETASONE FURO-FORMOTEROL FUM 200-5 MCG/ACT IN AERO
2.0000 | INHALATION_SPRAY | Freq: Two times a day (BID) | RESPIRATORY_TRACT | Status: DC
  Administered 2021-06-17 – 2021-06-19 (×3): 2 via RESPIRATORY_TRACT
  Filled 2021-06-17: qty 8.8

## 2021-06-17 MED ORDER — IOHEXOL 350 MG/ML SOLN
100.0000 mL | Freq: Once | INTRAVENOUS | Status: AC | PRN
  Administered 2021-06-17: 75 mL via INTRAVENOUS

## 2021-06-17 MED ORDER — LORAZEPAM 1 MG PO TABS
1.0000 mg | ORAL_TABLET | Freq: Once | ORAL | Status: AC
  Administered 2021-06-17: 1 mg via ORAL
  Filled 2021-06-17: qty 1

## 2021-06-17 MED ORDER — ONDANSETRON HCL 4 MG/2ML IJ SOLN
4.0000 mg | Freq: Four times a day (QID) | INTRAMUSCULAR | Status: DC | PRN
  Administered 2021-06-17: 4 mg via INTRAVENOUS
  Filled 2021-06-17: qty 2

## 2021-06-17 MED ORDER — SIMVASTATIN 20 MG PO TABS
40.0000 mg | ORAL_TABLET | Freq: Every day | ORAL | Status: DC
  Administered 2021-06-17 – 2021-06-18 (×2): 40 mg via ORAL
  Filled 2021-06-17 (×2): qty 2

## 2021-06-17 MED ORDER — TRAZODONE HCL 50 MG PO TABS
100.0000 mg | ORAL_TABLET | Freq: Every day | ORAL | Status: DC
  Administered 2021-06-17 – 2021-06-18 (×2): 100 mg via ORAL
  Filled 2021-06-17 (×2): qty 2

## 2021-06-17 MED ORDER — METOPROLOL SUCCINATE ER 25 MG PO TB24
25.0000 mg | ORAL_TABLET | Freq: Every morning | ORAL | Status: DC
  Administered 2021-06-18 – 2021-06-19 (×2): 25 mg via ORAL
  Filled 2021-06-17 (×2): qty 1

## 2021-06-17 MED ORDER — POLYETHYLENE GLYCOL 3350 17 G PO PACK: 17.0000 g | PACK | Freq: Every day | ORAL | Status: DC | PRN

## 2021-06-17 MED ORDER — HEPARIN SODIUM (PORCINE) 5000 UNIT/ML IJ SOLN: 5000.0000 [IU] | Freq: Three times a day (TID) | INTRAMUSCULAR | Status: DC

## 2021-06-17 MED ORDER — FINASTERIDE 5 MG PO TABS
5.0000 mg | ORAL_TABLET | Freq: Every day | ORAL | Status: DC
  Administered 2021-06-18 – 2021-06-19 (×2): 5 mg via ORAL
  Filled 2021-06-17 (×2): qty 1

## 2021-06-17 MED ORDER — SODIUM CHLORIDE 0.9% FLUSH
3.0000 mL | Freq: Two times a day (BID) | INTRAVENOUS | Status: DC
  Administered 2021-06-17: 3 mL via INTRAVENOUS

## 2021-06-17 MED ORDER — SODIUM CHLORIDE 0.9% FLUSH
3.0000 mL | Freq: Two times a day (BID) | INTRAVENOUS | Status: DC
  Administered 2021-06-18: 3 mL via INTRAVENOUS

## 2021-06-17 MED ORDER — TRAZODONE HCL 50 MG PO TABS: 50.0000 mg | ORAL_TABLET | Freq: Every evening | ORAL | Status: DC | PRN

## 2021-06-17 MED ORDER — ONDANSETRON 4 MG PO TBDP
4.0000 mg | ORAL_TABLET | Freq: Once | ORAL | Status: AC
  Administered 2021-06-17: 4 mg via ORAL
  Filled 2021-06-17: qty 1

## 2021-06-17 MED ORDER — ONDANSETRON HCL 4 MG PO TABS: 4.0000 mg | ORAL_TABLET | Freq: Four times a day (QID) | ORAL | Status: DC | PRN

## 2021-06-17 MED ORDER — LORAZEPAM 2 MG/ML IJ SOLN
0.5000 mg | Freq: Two times a day (BID) | INTRAMUSCULAR | Status: DC | PRN
Start: 2021-06-17 — End: 2021-06-19
  Administered 2021-06-18: 0.5 mg via INTRAVENOUS
  Filled 2021-06-17 (×2): qty 1

## 2021-06-17 MED ORDER — ACETAMINOPHEN 325 MG PO TABS: 650.0000 mg | ORAL_TABLET | Freq: Four times a day (QID) | ORAL | Status: DC | PRN

## 2021-06-17 NOTE — ED Notes (Signed)
Pt given breakfast tray.  Pt states that he is unable to tolerate swallowing food.  Pt drank some coffee but spit it back up.  Pt states that he is able to drink small sips of milk and tolerate them.  Informed edmd of this.

## 2021-06-17 NOTE — ED Notes (Signed)
Patient transported to CT 

## 2021-06-17 NOTE — ED Provider Notes (Signed)
[facility] Emergency Department Provider Note MRN:  [id number]  Arrival date & time: 06/17/21     Chief Complaint   Nausea and Emesis   History of Present Illness   Ryan Hansen is a 88 y.o. year-old male with a history of CAD presenting to the ED with chief complaint of nausea and vomiting.  Patient has felt generally unwell for the past day or 2.  Had an episode of vomiting this evening.  Just does not feel quite right.  No chest pain or shortness of breath, no abdominal pain, no fever.  Review of Systems  A thorough review of systems was obtained and all systems are negative except as noted in the HPI and PMH.   Patient's Health History    Past Medical History:  Diagnosis Date   Aortic stenosis    Asthma    BPH (benign prostatic hyperplasia)    CAD (coronary artery disease)    catheterization with 60% DX1, less than 50% circumflex, 40% LRAS   Elevated cholesterol    GERD (gastroesophageal reflux disease)    Hypertension    Murmur 12/24/12    Past Surgical History:  Procedure Laterality Date   CARDIAC CATHETERIZATION Left    No previous surgery      Family History  Problem Relation Age of Onset   Heart disease Other        No family history   Allergic rhinitis Neg Hx    Asthma Neg Hx    Eczema Neg Hx    Immunodeficiency Neg Hx    Urticaria Neg Hx    Angioedema Neg Hx     Social History   Socioeconomic History   Marital status: Married    Spouse name: Not on file   Number of children: 3   Years of education: Not on file   Highest education level: Not on file  Occupational History   Not on file  Tobacco Use   Smoking status: Never   Smokeless tobacco: Never  Substance and Sexual Activity   Alcohol use: No   Drug use: No   Sexual activity: Not on file  Other Topics Concern   Not on file  Social History Narrative   Not on file   Social Determinants of Health   Financial Resource Strain: Not on file  Food Insecurity: Not  on file  Transportation Needs: Not on file  Physical Activity: Not on file  Stress: Not on file  Social Connections: Not on file  Intimate Partner Violence: Not on file     Physical Exam   Vitals:   06/17/21 0445 06/17/21 0500  BP: (!) 168/76 (!) 178/70  Pulse: 72 69  Resp: (!) 23 17  Temp:    SpO2: 98% 93%    CONSTITUTIONAL: Well-appearing, NAD NEURO/PSYCH:  Alert and oriented x 3, no focal deficits EYES:  eyes equal and reactive ENT/NECK:  no LAD, no JVD CARDIO: Regular rate, well-perfused, normal S1 and S2 PULM:  CTAB no wheezing or rhonchi GI/GU:  non-distended, non-tender MSK/SPINE:  No gross deformities, no edema SKIN:  no rash, atraumatic   *Additional and/or pertinent findings included in MDM below  Diagnostic and Interventional Summary    EKG Interpretation  Date/Time:  Friday June 17 2021 03:13:06 EST Ventricular Rate:  67 PR Interval:  181 QRS Duration: 97 QT Interval:  429 QTC Calculation: 453 R Axis:   45 Text Interpretation: Sinus rhythm Abnormal R-wave progression, early transition Baseline wander in lead(s) V3  V6 Confirmed by Gerlene Fee [phone]) on 06/17/2021 3:55:21 AM       Labs Reviewed  COMPREHENSIVE METABOLIC PANEL - Abnormal; Notable for the following components:      Result Value   BUN 27 (*)    Total Protein 8.7 (*)    All other components within normal limits  I-STAT CHEM 8, ED - Abnormal; Notable for the following components:   BUN 26 (*)    All other components within normal limits  RESP PANEL BY RT-PCR (FLU A&B, COVID) ARPGX2  ETHANOL  PROTIME-INR  APTT  CBC  DIFFERENTIAL  RAPID URINE DRUG SCREEN, HOSP PERFORMED  URINALYSIS, ROUTINE W REFLEX MICROSCOPIC  CBG MONITORING, ED    CT ANGIO HEAD NECK W WO CM  Final Result    MR BRAIN WO CONTRAST    (Results Pending)    Medications  ondansetron (ZOFRAN-ODT) disintegrating tablet 4 mg (4 mg Oral Given 06/17/21 0306)  iohexol (OMNIPAQUE) 350 MG/ML injection 100 mL (75 mLs  Intravenous Contrast Given 06/17/21 0513)     Procedures  /  Critical Care Procedures  ED Course and Medical Decision Making  Initial Impression and Ddx General malaise, single episode of vomiting this evening.  No chest pain.  No shortness of breath, abdomen is completely soft and nontender with no rebound guarding or rigidity.  Given history of CAD will obtain screening EKG, check COVID, monitor closely.  Currently no indication for more invasive testing or imaging.  Past medical/surgical history that increases complexity of ED encounter: Advanced age, CAD  Interpretation of Diagnostics I personally reviewed the EKG and my interpretation is as follows: Sinus rhythm, no ischemic findings  Labs overall reassuring with no significant electrolyte or blood count disturbance  Patient Reassessment and Ultimate Disposition/Management Clinical Course as of 06/17/21 0602  Fri Jun 17, 2021  0442 Patient is now endorsing trouble or inability to swallow which has been present for over 24 hours.  His neurological exam is otherwise reassuring.  However given his advanced age and general malaise, will initiate stroke work-up. [MB]    Clinical Course User Index [MB] Maudie Flakes, MD    CT imaging is without obvious acute stroke.  Will obtain MRI for further assessment.  Signed out to oncoming provider at shift change.   Patient management required discussion with the following services or consulting groups:  None  Complexity of Problems Addressed Acute illness or injury that poses threat of life of bodily function  Additional Data Reviewed and Analyzed Further history obtained from: Past medical history and medications listed in the EMR  Additional Factors Impacting ED Encounter Risk Consideration of hospitalization  Barth Kirks. Sedonia Small, [place] [email or web address]  Final Clinical Impressions(s) / ED Diagnoses     ICD-10-CM   1.  Dysphagia, unspecified type  R13.10     2. Malaise  R53.81     3. Nausea and vomiting, unspecified vomiting type  R11.2       ED Discharge Orders     None        Discharge Instructions Discussed with and Provided to Patient:   Discharge Instructions   None      Maudie Flakes, MD 06/17/21 [phone]

## 2021-06-17 NOTE — ED Notes (Signed)
Back from CT

## 2021-06-17 NOTE — ED Provider Notes (Signed)
7:15 AM-call from Dr. Sedonia Small to evaluate patient after imaging, MRI to evaluate for acute intracranial abnormality.  Patient presented for evaluation of vomiting after being sick for 2 days.  He had vague symptoms of "not feeling right."  MRI head interpretation: IMPRESSION:  1. No evidence of acute intracranial pathology.  2. 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 There is no evidence of superimposed acute hemorrhage,  and no surrounding edema or mass effect.   I reviewed the images  9:10 AM-able to tolerate water without vomiting.  Meal tray ordered for breakfast  10:40 AM-he was unable to tolerate additional liquids or food.  I discussed the case with Dr. Laural Golden, gastroenterology, who recommends that he be admitted for endoscopy tomorrow.  Will contact hospitalist for that.  I have informed the patient's son of the findings and plan and he is agreeable.  He was wondering if the patient "had a touch of dementia."   Daleen Bo, MD 06/17/21 1052

## 2021-06-17 NOTE — ED Triage Notes (Signed)
Patient arrived in private vehicle from home complaining of feeling "lousy" with nausea and vomiting at home. Patient states he has not been able to keep food or fluids down. No complaints of chest pain or shortness of breath.

## 2021-06-17 NOTE — Consult Note (Signed)
@LOGO @   Referring Provider: Daleen Bo, MD Primary Care Physician:  Lemmie Evens, MD Primary Gastroenterologist:  Dr. Gala Romney  Date of Admission: 06/17/21 Date of Consultation: 06/17/21  Reason for Consultation:  Dysphagia   HPI:  Ryan Hansen is a 88 y.o. year old male with history of CAD, aortic stenosis, asthma, HTN, HLD, GERD, who presented to the ED with chief complaint of nausea and vomiting, generalized unwell feeling x2 days and inability to swallow x24 hours.  Work-up in the ED revealed negative respiratory panel, CBC within normal limits, slightly elevated BUN at 27, otherwise chemistry panel essentially normal, UDS negative. CT angio head and neck with fluid distended esophagus where seen in the upper chest, branching low-density in the ventral right brain, likely dilated perivascular space although associated with calcification which is uncommon, presumed meningioma along floor of the right anterior cranial fossa, incidental. MRI brain with no acute intracranial pathology, single abnormality in the right basal ganglia corresponding to hypodensity on CT favoring a cavernoma with no superimposed hemorrhage or edema/mass effect.  GI consulted for further evaluation.  Today:  Very poor historian. Knows he is in [place], but didn't know he was at the hospital. Reports he was told to come here for a meeting. Reports he doesn't swallow as well as he used to, but isn't able to give any details on this. Can't tell me if he has trouble with liquids or solids. Reports he has felt items getting hung in his chest in the past, but this is rare, but not recently. Denies heartburn or abdominal pain. Reports occasional nausea without vomiting, but again can't give me details on this. Denies vomiting in the last couple of days. Then states he should go outside to walk around to see if he needed to vomit. Denies brbpr or melena.   No prior EGD. Not sure he wants to have an EGD. Clearly  doesn't understand his condition fully and doesn't have the capacity to make his own decisions. Keeps asking to go outside or sit in his car. Wants to follow-up with his primary doctor.   Spoke with patient's son, Ryan Hansen. States he is not aware of any vomiting issues, but his dad has been telling him he has some trouble swallowing for a least a couple of months. Hasn't reported any heartburn symptoms, abdominal pain, brbpr, melena, changes in bowl habits. Ryan Hansen gave consent to proceed with endoscopy and states he actually spoke with his dad about this earlier today and his dad was on board at that time.  Ryan Hansen plans to come back to the hospital this afternoon.  Son also tells me that patient has had some issues with his memory and has been becoming more paranoid especially at night.  States he has guns throughout the house and has chairs in front of the door, calls them late at night.   Colonoscopy April 2003 with abnormal rectosigmoid to 35 cm consistent with colitis, nonspecific possibly idiopathic or infectious s/p biopsies and stool taken, left-sided diverticula, otherwise normal exam. Colonoscopy in 2010: Friable anal canal with single external/anal canal hemorrhoidal tag, otherwise normal rectum.  Left-sided transverse diverticula with submucosal petechia about several sigmoid diverticula, doubtful clinical significance, otherwise normal colon and normal TI.  Past Medical History:  Diagnosis Date   Aortic stenosis    Asthma    BPH (benign prostatic hyperplasia)    CAD (coronary artery disease)    catheterization with 60% DX1, less than 50% circumflex, 40% LRAS   Elevated cholesterol  GERD (gastroesophageal reflux disease)    Hypertension    Murmur 12/24/12    Past Surgical History:  Procedure Laterality Date   CARDIAC CATHETERIZATION Left    No previous surgery      Prior to Admission medications   Medication Sig Start Date End Date Taking? Authorizing Provider   budesonide-formoterol (SYMBICORT) 160-4.5 MCG/ACT inhaler Use 2 puffs twice daily to prevent cough or wheeze. Rinse, gargle and spit after use. 12/07/15  Yes Valentina Shaggy, MD  cetirizine (ZYRTEC) 5 MG tablet Take 1 tablet (5 mg total) by mouth daily. 06/16/21  Yes Jaynee Eagles, PA-C  finasteride (PROSCAR) 5 MG tablet Take 5 mg by mouth daily. 01/08/21  Yes [provider]  fluticasone (FLONASE) 50 MCG/ACT nasal spray Place 2 sprays into both nostrils daily. 12/07/15  Yes Valentina Shaggy, MD  KRILL OIL PO Take 1 tablet by mouth every morning.   Yes [provider]  metoprolol succinate (TOPROL-XL) 25 MG 24 hr tablet Take 25 mg by mouth every morning. 01/08/21  Yes [provider]  mirtazapine (REMERON) 15 MG tablet Take 3.75 mg by mouth at bedtime as needed.   Yes [provider]  simvastatin (ZOCOR) 40 MG tablet TAKE ONE (1) TABLET EACH DAY Patient taking differently: Take 40 mg by mouth daily. 06/21/15  Yes Lelon Perla, MD  Glycopyrrolate 1 MG/5ML SOLN Take 5 mLs (1 mg total) by mouth 2 (two) times daily as needed. Patient not taking: Reported on 06/17/2021 06/16/21   Jaynee Eagles, PA-C  metoprolol tartrate (LOPRESSOR) 25 MG tablet TAKE ONE-HALF TABLET BY MOUTH TWICE A DAY Patient not taking: Reported on 06/17/2021 03/08/16   Arnoldo Lenis, MD  ondansetron (ZOFRAN-ODT) 4 MG disintegrating tablet Take 1 tablet (4 mg total) by mouth every 8 (eight) hours as needed for nausea or vomiting. Patient not taking: Reported on 06/17/2021 06/16/21   Jaynee Eagles, PA-C  PROAIR HFA 108 (90 BASE) MCG/ACT inhaler Inhale 1-2 puffs into the lungs every 6 (six) hours as needed for wheezing or shortness of breath. 03/02/15   Arnoldo Lenis, MD  Spacer/Aero-Holding Chambers (AEROCHAMBER PLUS) inhaler Use as instructed 12/07/15   Valentina Shaggy, MD    No current facility-administered medications for this encounter.    Allergies as of 06/17/2021 - Review  Complete 06/17/2021  Allergen Reaction Noted   Procaine hcl      Family History  Problem Relation Age of Onset   Heart disease Other        No family history   Allergic rhinitis Neg Hx    Asthma Neg Hx    Eczema Neg Hx    Immunodeficiency Neg Hx    Urticaria Neg Hx    Angioedema Neg Hx     Social History   Socioeconomic History   Marital status: Married    Spouse name: Not on file   Number of children: 3   Years of education: Not on file   Highest education level: Not on file  Occupational History   Not on file  Tobacco Use   Smoking status: Never   Smokeless tobacco: Never  Substance and Sexual Activity   Alcohol use: No   Drug use: No   Sexual activity: Not on file  Other Topics Concern   Not on file  Social History Narrative   Not on file   Social Determinants of Health   Financial Resource Strain: Not on file  Food Insecurity: Not on file  Transportation Needs:  Not on file  Physical Activity: Not on file  Stress: Not on file  Social Connections: Not on file  Intimate Partner Violence: Not on file    Review of Systems: Gen: Denies fever, chills, cold or flulike symptoms, presyncope, syncope. CV: Denies chest pain, heart palpitations. Resp: Denies shortness of breath or cough. GI: See HPI GU : Denies urinary burning, urinary frequency, urinary incontinence.  MS: Denies joint pain Derm: Denies rash Psych: Denies depression, anxiety.  Heme: See HPI  Physical Exam: Vital signs in last 24 hours: Temp:  [97.8 F (36.6 C)-98.9 F (37.2 C)] 98.1 F (36.7 C) (02/24 1327) Pulse Rate:  [62-74] 74 (02/24 1327) Resp:  [13-26] 18 (02/24 1327) BP: (148-189)/(65-88) 148/86 (02/24 1327) SpO2:  [93 %-100 %] 97 % (02/24 1327) Weight:  [68 kg] 68 kg (02/24 0246) Last BM Date : 06/16/21 General:   Alert,  Well-developed, well-nourished, pleasant and cooperative in NAD Head:  Normocephalic and atraumatic. Eyes:  Sclera clear, no icterus.   Conjunctiva  pink. Ears:  Normal auditory acuity. Lungs:  Clear throughout to auscultation.   No wheezes, crackles, or rhonchi. No acute distress. Heart:  Regular rate and rhythm; no murmurs, clicks, rubs,  or gallops. Abdomen:  Soft, nontender and nondistended. No masses, hepatosplenomegaly or hernias noted. Normal bowel sounds, without guarding, and without rebound.   Rectal:  Deferred  Msk:  Symmetrical without gross deformities. Normal posture. Extremities:  Without edema. Neurologic:  Alert, oriented to self.  Knows the date and time and knew he was in [place], but could not tell me he was at [facility].  He is unaware of the situation reporting he came for a meeting.  Skin:  Intact without significant lesions or rashes. Cervical Nodes:  No significant cervical adenopathy. Psych:  Normal mood and affect.  Intake/Output from previous day: No intake/output data recorded. Intake/Output this shift: No intake/output data recorded.  Lab Results: Recent Labs    06/17/21 0436 06/17/21 0453  WBC 9.3  --   HGB 14.4 15.0  HCT 44.6 44.0  PLT 274  --    BMET Recent Labs    06/17/21 0436 06/17/21 0453  NA 140 142  K 4.0 4.0  CL 106 107  CO2 26  --   GLUCOSE 90 92  BUN 27* 26*  CREATININE 1.12 1.00  CALCIUM 9.1  --    LFT Recent Labs    06/17/21 0436  PROT 8.7*  ALBUMIN 4.1  AST 15  ALT 13  ALKPHOS 60  BILITOT 0.9   PT/INR Recent Labs    06/17/21 0436  LABPROT 13.1  INR 1.0    Studies/Results: CT ANGIO HEAD NECK W WO CM  Result Date: 06/17/2021 CLINICAL DATA:  Neuro deficit with acute stroke suspected. Nausea and vomiting at home EXAM: CT ANGIOGRAPHY HEAD AND NECK TECHNIQUE: Multidetector CT imaging of the head and neck was performed using the standard protocol during bolus administration of intravenous contrast. Multiplanar CT image reconstructions and MIPs were obtained to evaluate the vascular anatomy. Carotid stenosis measurements (when applicable) are obtained  utilizing NASCET criteria, using the distal internal carotid diameter as the denominator. RADIATION DOSE REDUCTION: This exam was performed according to the departmental dose-optimization program which includes automated exposure control, adjustment of the mA and/or kV according to patient size and/or use of iterative reconstruction technique. CONTRAST:  7mL OMNIPAQUE IOHEXOL 350 MG/ML SOLN COMPARISON:  None. FINDINGS: CT HEAD FINDINGS Brain: No evidence of acute infarction, hemorrhage, hydrocephalus, extra-axial collection or  mass lesion/mass effect. Branching low-density along the lower right brain with adjacent mineralization, favor dilated perivascular space with calcification, although the calcification is atypical. Generalized cerebral volume loss. On postcontrast imaging there is faint dural based nodular enhancement along the inferior right frontal lobe measuring 8 mm. Vascular:  See below Skull: Normal. Negative for fracture or focal lesion. Sinuses: Imaged portions are clear. Orbits: No acute finding. Review of the MIP images confirms the above findings CTA NECK FINDINGS Aortic arch: Atheromatous plaque with 3 vessel branching. No acute finding. Right carotid system: Mild plaque at the bifurcation without stenosis or ulceration. Additional calcified plaque at the distal segment of the ICA. Left carotid system: Atheromatous plaque at the bifurcation without significant stenosis or ulceration. Vertebral arteries: No proximal subclavian stenosis. Calcified plaque at the left vertebral origin with only mild narrowing. Both vertebral arteries are widely patent to the dura. Skeleton: Generalized cervical spine degeneration with C6-7 and C7-T1 anterolisthesis. Other neck: No acute finding Upper chest: Fluid distended esophagus possibly related to history of vomiting. No similar appearance on recent abdominal CT 05/04/2021 Review of the MIP images confirms the above findings CTA HEAD FINDINGS Anterior  circulation: Atheromatous calcification of the carotid siphons. No flow limiting stenosis, branch occlusion, beading, or aneurysm. Posterior circulation: Mild calcified plaque on the left V4 segment. The vertebral and basilar arteries are smoothly contoured and widely patent. Negative for aneurysm or vascular malformation Venous sinuses: Negative Anatomic variants: None significant Review of the MIP images confirms the above findings IMPRESSION: 1. No emergent finding vascular finding. Mild for age atherosclerosis. 2. Fluid distended esophagus where seen in the upper chest. 3. Branching low-density in the ventral right brain, likely dilated perivascular space although associated with calcification which is uncommon. Attention on any planned brain MRI. 4. Presumed meningioma along the floor of the right anterior cranial fossa measuring 8 mm, incidental. Electronically Signed   By: Jorje Guild M.D.   On: 06/17/2021 05:43   MR BRAIN WO CONTRAST  Result Date: 06/17/2021 CLINICAL DATA:  Nausea and vomiting today, stroke suspected EXAM: MRI HEAD WITHOUT CONTRAST TECHNIQUE: Multiplanar, multiecho pulse sequences of the brain and surrounding structures were obtained without intravenous contrast. COMPARISON:  CT/CTA head and neck obtained earlier the same day FINDINGS: Brain: There is a cystic area in the right basal ganglia corresponding to the hypodensity seen on the prior CT measuring approximally 1.4 cm x 1.0 cm. There is a rim of T2 hypointensity with significant blooming artifact on the SWI sequence. There is curvilinear diffusion restriction along the posterior margin. A separate cystic area in the anterior limb of the internal capsule is favored to reflect a dilated perivascular space. There is no evidence of acute intracranial hemorrhage or extra-axial fluid collection. There is no evidence of acute territorial infarct. There is a background of moderate global parenchymal volume loss with prominence of the  ventricular system and extra-axial CSF spaces. Patchy FLAIR signal abnormality in the subcortical and periventricular white matter likely reflects sequela of mild chronic white matter microangiopathy. There is no solid mass lesion.  There is no midline shift. Vascular: Normal flow voids. Skull and upper cervical spine: Normal marrow signal. Sinuses/Orbits: The paranasal sinuses are clear. The globes and orbits are unremarkable. Other: None. IMPRESSION: 1. No evidence of acute intracranial pathology. 2. 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 There is no evidence of superimposed acute hemorrhage, and no surrounding edema  or mass effect. Electronically Signed   By: Valetta Mole M.D.   On: 06/17/2021 08:17    Impression: 88 year old male with history of CAD, aortic stenosis, asthma, HTN, HLD, GERD, undiagnosed but suspected dementia, who presented to the emergency room with chief complaint of nausea and vomiting, generalized unwell feeling, inability to swallow.  Unfortunately, due to patient's dementia, he is unable to give a reliable history.  Spoke with patient's son Ryan Hansen who tells me patient has been reporting trouble with swallowing, inability to get foods to go down intermittently for the last couple of months.  Unaware of any vomiting, reflux symptoms, abdominal pain.  In the ED, he was given a breakfast tray and was unable to swallow the foods, drink coffee but spit it back up.  Head and neck CT with fluid distended esophagus in the upper chest.  He needs an EGD for further evaluation of dysphagia; may have underlying esophageal web, ring, stricture, achalasia, and cannot rule out malignancy.  As patient does not have the capacity to make decisions for himself, obtain consent from his son, Ryan Hansen.  Abnormal CT colon:  Of note, CT A/P with contrast on 05/04/2021 with diffuse colonic diverticulosis,  focal wall thickening of sigmoid colon with minimal infiltration of the fat at this level, stated findings may be related to colonic mass, mild acute diverticulitis not excluded.  He was treated with Cipro and Flagyl at that time.  He denies any abdominal pain whatsoever, labs reassuring, and abdominal exam is benign today.  No history of colon polyps.  Last colonoscopy in 2010.  Due to dysphagia, unable to prep for colonoscopy at this time, but would need to consider this in the future.  Suspected dementia:  Patient clearly confused today about his current situation and has poor trouble remember much about recent visits to urgent care and hospital. Son notes intermittent trouble with patient's memory recently. He is becoming more paranoid at night. Reports he has guns all throughout the house and chairs in front of doors.  Concerned that patient is likely not safe to be living by himself or driving.  Discussed with Dr. Denton Brick. Will leave further management of this to hospitalist.   Plan: NPO Plan for EGD with propofol Dr. Laural Golden tomorrow. May need to consider intubation due to fluid filled esophagus. Will discuss with Dr. Laural Golden. Consent has been obtained from patient's son Ryan Hansen.  Consider colonoscopy in the future once able to swallow due to abnormal CT colon.  Management of dementia per hospitalist.    LOS: 0 days    06/17/2021, 2:05 PM   Aliene Altes, [facility]

## 2021-06-17 NOTE — ED Notes (Signed)
Patient called out stating he felt like he could not swallow. Patient stated that it started yesterday. When the patient takes a sip of water the sits up and is unable to swallow. MD was made are.

## 2021-06-17 NOTE — ED Notes (Signed)
Patient ambulated to the bathroom with assistance.

## 2021-06-17 NOTE — H&P (Signed)
Patient Demographics:    Ryan Hansen, is a 88 y.o. male  MRN: [id number]   DOB - 04/07/1929  Admit Date - 06/17/2021  Outpatient Primary MD for the patient is Lemmie Evens, MD   Assessment & Plan:   Assessment and Plan:   1) dysphagia and sialorrhea and Sialorrhea -- -Patient reports excessive salivation and postnasal drainage -Nausea with difficulty swallowing -GI consult appreciated plans for EGD in a.m. -GI service would like to try glucagon in case patient has food impaction  2)CAD/HTN--continue Toprol-XL and simvastatin -May use aspirin postprocedure  3) asthma--- no acute exacerbation, continue bronchodilator  4) BPH--- continue Proscar  5) dementia--patient with obvious cognitive and memory deficits -Patient's son reports that this is not new but has been getting worse lately -Patient lives alone and continues to drive -Advised further evaluation by neurology or PCP -Leaning  strongly towards patient no longer driving   Disposition/Need for in-Hospital Stay- patient unable to be discharged at this time due to dysphagia currently n.p.o. requiring IV fluids pending EGD  Dispo: The patient is from: Home              Anticipated d/c is to: Home              Anticipated d/c date is: 1 day              Patient currently is not medically stable to d/c. Barriers: Not Clinically Stable-    With History of - Reviewed by me  Past Medical History:  Diagnosis Date   Aortic stenosis    Asthma    BPH (benign prostatic hyperplasia)    CAD (coronary artery disease)    catheterization with 60% DX1, less than 50% circumflex, 40% LRAS   Elevated cholesterol    GERD (gastroesophageal reflux disease)    Hypertension    Murmur 12/24/12      Past Surgical History:  Procedure Laterality Date   CARDIAC  CATHETERIZATION Left    No previous surgery        Chief Complaint  Patient presents with   Nausea   Emesis      HPI:    Ryan Hansen  is a 88 y.o. male history of CAD, aortic stenosis, asthma, HTN, HLD, GERD and obvious cognitive and memory deficits presents to the ED with concerns about difficulty with swallowing for the last 4 months worse over the last couple days - Patient is a poor historian unclear if he had any significant weight loss -Additional history obtained from patient's son Olivia Mackie at bedside - In the ED-CT angio head and neck with fluid distended esophagus where seen in the upper chest, branching low-density in the ventral right brain, likely dilated perivascular space although associated with calcification which is uncommon, presumed meningioma along floor of the right anterior cranial fossa, incidental. MRI brain with no acute intracranial pathology, single abnormality in the right basal ganglia corresponding to hypodensity on CT favoring  a cavernoma with no superimposed hemorrhage or edema/mass effect. -CBC and chemistry without significant abnormality -UDS negative No fever  Or chills   No Diarrhea -Patient is forgetful and repeating himself -Patient reports excessive salivation and postnasal drainage    Review of systems:    In addition to the HPI above,   A full Review of  Systems was done, all other systems reviewed are negative except as noted above in HPI , .    Social History:  Reviewed by me    Social History   Tobacco Use   Smoking status: Never   Smokeless tobacco: Never  Substance Use Topics   Alcohol use: No       Family History :  Reviewed by me    Family History  Problem Relation Age of Onset   Heart disease Other        No family history   Allergic rhinitis Neg Hx    Asthma Neg Hx    Eczema Neg Hx    Immunodeficiency Neg Hx    Urticaria Neg Hx    Angioedema Neg Hx     Home Medications:   Prior to Admission  medications   Medication Sig Start Date End Date Taking? Authorizing Provider  budesonide-formoterol (SYMBICORT) 160-4.5 MCG/ACT inhaler Use 2 puffs twice daily to prevent cough or wheeze. Rinse, gargle and spit after use. 12/07/15  Yes Valentina Shaggy, MD  cetirizine (ZYRTEC) 5 MG tablet Take 1 tablet (5 mg total) by mouth daily. 06/16/21  Yes Jaynee Eagles, PA-C  finasteride (PROSCAR) 5 MG tablet Take 5 mg by mouth daily. 01/08/21  Yes [provider]  fluticasone (FLONASE) 50 MCG/ACT nasal spray Place 2 sprays into both nostrils daily. 12/07/15  Yes Valentina Shaggy, MD  KRILL OIL PO Take 1 tablet by mouth every morning.   Yes [provider]  metoprolol succinate (TOPROL-XL) 25 MG 24 hr tablet Take 25 mg by mouth every morning. 01/08/21  Yes [provider]  mirtazapine (REMERON) 15 MG tablet Take 3.75 mg by mouth at bedtime as needed.   Yes [provider]  simvastatin (ZOCOR) 40 MG tablet TAKE ONE (1) TABLET EACH DAY Patient taking differently: Take 40 mg by mouth daily. 06/21/15  Yes Lelon Perla, MD  Glycopyrrolate 1 MG/5ML SOLN Take 5 mLs (1 mg total) by mouth 2 (two) times daily as needed. Patient not taking: Reported on 06/17/2021 06/16/21   Jaynee Eagles, PA-C  metoprolol tartrate (LOPRESSOR) 25 MG tablet TAKE ONE-HALF TABLET BY MOUTH TWICE A DAY Patient not taking: Reported on 06/17/2021 03/08/16   Arnoldo Lenis, MD  ondansetron (ZOFRAN-ODT) 4 MG disintegrating tablet Take 1 tablet (4 mg total) by mouth every 8 (eight) hours as needed for nausea or vomiting. Patient not taking: Reported on 06/17/2021 06/16/21   Jaynee Eagles, PA-C  PROAIR HFA 108 (90 BASE) MCG/ACT inhaler Inhale 1-2 puffs into the lungs every 6 (six) hours as needed for wheezing or shortness of breath. 03/02/15   Arnoldo Lenis, MD  Spacer/Aero-Holding Chambers (AEROCHAMBER PLUS) inhaler Use as instructed 12/07/15   Valentina Shaggy, MD     Allergies:     Allergies   Allergen Reactions   Procaine Hcl     REACTION: ANXIOUSNESS AND NERVOUSNESS     Physical Exam:   Vitals  Blood pressure 140/71, pulse 73, temperature 98.4 F (36.9 C), temperature source Oral, resp. rate 18, height 5\' 9"  (1.753 m), weight 68 kg, SpO2 97 %.  Physical Examination: General appearance - alert,  , and in no distress  Mental status - alert, oriented to person, place, and time, Eyes - sclera anicteric Neck - supple, no JVD elevation , Chest - clear  to auscultation bilaterally, symmetrical air movement,  Heart - S1 and S2 normal, regular  Abdomen - soft, nontender, nondistended, no masses or organomegaly Neurological - screening mental status exam normal, neck supple without rigidity, cranial nerves II through XII intact, DTR's normal and symmetric Extremities - no pedal edema noted, intact peripheral pulses  Skin - warm, dry     Data Review:    CBC Recent Labs  Lab 06/17/21 0436 06/17/21 0453  WBC 9.3  --   HGB 14.4 15.0  HCT 44.6 44.0  PLT 274  --   MCV 88.5  --   MCH 28.6  --   MCHC 32.3  --   RDW 13.8  --   LYMPHSABS 1.3  --   MONOABS 0.9  --   EOSABS 0.2  --   BASOSABS 0.0  --    ------------------------------------------------------------------------------------------------------------------  Chemistries  Recent Labs  Lab 06/17/21 0436 06/17/21 0453  NA 140 142  K 4.0 4.0  CL 106 107  CO2 26  --   GLUCOSE 90 92  BUN 27* 26*  CREATININE 1.12 1.00  CALCIUM 9.1  --   AST 15  --   ALT 13  --   ALKPHOS 60  --   BILITOT 0.9  --    ------------------------------------------------------------------------------------------------------------------ estimated creatinine clearance is 47.2 mL/min (by C-G formula based on SCr of 1 mg/dL). ------------------------------------------------------------------------------------------------------------------ No results for input(s): TSH, T4TOTAL, T3FREE, THYROIDAB in the last 72 hours.  Invalid  input(s): FREET3   Coagulation profile Recent Labs  Lab 06/17/21 0436  INR 1.0   ------------------------------------------------------------------------------------------------------------------- No results for input(s): DDIMER in the last 72 hours. -------------------------------------------------------------------------------------------------------------------  Cardiac Enzymes No results for input(s): CKMB, TROPONINI, MYOGLOBIN in the last 168 hours.  Invalid input(s): CK ------------------------------------------------------------------------------------------------------------------ No results found for: BNP   ---------------------------------------------------------------------------------------------------------------  Urinalysis    Component Value Date/Time   COLORURINE YELLOW 06/17/2021 0646   APPEARANCEUR CLEAR 06/17/2021 0646   LABSPEC 1.015 06/17/2021 0646   PHURINE 5.0 06/17/2021 0646   GLUCOSEU NEGATIVE 06/17/2021 0646   HGBUR MODERATE (A) 06/17/2021 0646   BILIRUBINUR NEGATIVE 06/17/2021 0646   KETONESUR 15 (A) 06/17/2021 0646   PROTEINUR NEGATIVE 06/17/2021 0646   UROBILINOGEN 0.2 08/07/2014 0245   NITRITE NEGATIVE 06/17/2021 0646   LEUKOCYTESUR NEGATIVE 06/17/2021 0646    ----------------------------------------------------------------------------------------------------------------   Imaging Results:    CT ANGIO HEAD NECK W WO CM  Result Date: 06/17/2021 CLINICAL DATA:  Neuro deficit with acute stroke suspected. Nausea and vomiting at home EXAM: CT ANGIOGRAPHY HEAD AND NECK TECHNIQUE: Multidetector CT imaging of the head and neck was performed using the standard protocol during bolus administration of intravenous contrast. Multiplanar CT image reconstructions and MIPs were obtained to evaluate the vascular anatomy. Carotid stenosis measurements (when applicable) are obtained utilizing NASCET criteria, using the distal internal carotid diameter as the  denominator. RADIATION DOSE REDUCTION: This exam was performed according to the departmental dose-optimization program which includes automated exposure control, adjustment of the mA and/or kV according to patient size and/or use of iterative reconstruction technique. CONTRAST:  54mL OMNIPAQUE IOHEXOL 350 MG/ML SOLN COMPARISON:  None. FINDINGS: CT HEAD FINDINGS Brain: No evidence of acute infarction, hemorrhage, hydrocephalus, extra-axial collection or mass lesion/mass effect. Branching low-density along the lower right brain with adjacent mineralization,  favor dilated perivascular space with calcification, although the calcification is atypical. Generalized cerebral volume loss. On postcontrast imaging there is faint dural based nodular enhancement along the inferior right frontal lobe measuring 8 mm. Vascular:  See below Skull: Normal. Negative for fracture or focal lesion. Sinuses: Imaged portions are clear. Orbits: No acute finding. Review of the MIP images confirms the above findings CTA NECK FINDINGS Aortic arch: Atheromatous plaque with 3 vessel branching. No acute finding. Right carotid system: Mild plaque at the bifurcation without stenosis or ulceration. Additional calcified plaque at the distal segment of the ICA. Left carotid system: Atheromatous plaque at the bifurcation without significant stenosis or ulceration. Vertebral arteries: No proximal subclavian stenosis. Calcified plaque at the left vertebral origin with only mild narrowing. Both vertebral arteries are widely patent to the dura. Skeleton: Generalized cervical spine degeneration with C6-7 and C7-T1 anterolisthesis. Other neck: No acute finding Upper chest: Fluid distended esophagus possibly related to history of vomiting. No similar appearance on recent abdominal CT 05/04/2021 Review of the MIP images confirms the above findings CTA HEAD FINDINGS Anterior circulation: Atheromatous calcification of the carotid siphons. No flow limiting  stenosis, branch occlusion, beading, or aneurysm. Posterior circulation: Mild calcified plaque on the left V4 segment. The vertebral and basilar arteries are smoothly contoured and widely patent. Negative for aneurysm or vascular malformation Venous sinuses: Negative Anatomic variants: None significant Review of the MIP images confirms the above findings IMPRESSION: 1. No emergent finding vascular finding. Mild for age atherosclerosis. 2. Fluid distended esophagus where seen in the upper chest. 3. Branching low-density in the ventral right brain, likely dilated perivascular space although associated with calcification which is uncommon. Attention on any planned brain MRI. 4. Presumed meningioma along the floor of the right anterior cranial fossa measuring 8 mm, incidental. Electronically Signed   By: Jorje Guild M.D.   On: 06/17/2021 05:43   MR BRAIN WO CONTRAST  Result Date: 06/17/2021 CLINICAL DATA:  Nausea and vomiting today, stroke suspected EXAM: MRI HEAD WITHOUT CONTRAST TECHNIQUE: Multiplanar, multiecho pulse sequences of the brain and surrounding structures were obtained without intravenous contrast. COMPARISON:  CT/CTA head and neck obtained earlier the same day FINDINGS: Brain: There is a cystic area in the right basal ganglia corresponding to the hypodensity seen on the prior CT measuring approximally 1.4 cm x 1.0 cm. There is a rim of T2 hypointensity with significant blooming artifact on the SWI sequence. There is curvilinear diffusion restriction along the posterior margin. A separate cystic area in the anterior limb of the internal capsule is favored to reflect a dilated perivascular space. There is no evidence of acute intracranial hemorrhage or extra-axial fluid collection. There is no evidence of acute territorial infarct. There is a background of moderate global parenchymal volume loss with prominence of the ventricular system and extra-axial CSF spaces. Patchy FLAIR signal abnormality in  the subcortical and periventricular white matter likely reflects sequela of mild chronic white matter microangiopathy. There is no solid mass lesion.  There is no midline shift. Vascular: Normal flow voids. Skull and upper cervical spine: Normal marrow signal. Sinuses/Orbits: The paranasal sinuses are clear. The globes and orbits are unremarkable. Other: None. IMPRESSION: 1. No evidence of acute intracranial pathology. 2. 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 There is no evidence of superimposed acute hemorrhage, and no surrounding edema or mass effect. Electronically Signed   By: Court Joy.D.  On: 06/17/2021 08:17    Radiological Exams on Admission: CT ANGIO HEAD NECK W WO CM  Result Date: 06/17/2021 CLINICAL DATA:  Neuro deficit with acute stroke suspected. Nausea and vomiting at home EXAM: CT ANGIOGRAPHY HEAD AND NECK TECHNIQUE: Multidetector CT imaging of the head and neck was performed using the standard protocol during bolus administration of intravenous contrast. Multiplanar CT image reconstructions and MIPs were obtained to evaluate the vascular anatomy. Carotid stenosis measurements (when applicable) are obtained utilizing NASCET criteria, using the distal internal carotid diameter as the denominator. RADIATION DOSE REDUCTION: This exam was performed according to the departmental dose-optimization program which includes automated exposure control, adjustment of the mA and/or kV according to patient size and/or use of iterative reconstruction technique. CONTRAST:  32mL OMNIPAQUE IOHEXOL 350 MG/ML SOLN COMPARISON:  None. FINDINGS: CT HEAD FINDINGS Brain: No evidence of acute infarction, hemorrhage, hydrocephalus, extra-axial collection or mass lesion/mass effect. Branching low-density along the lower right brain with adjacent mineralization, favor dilated perivascular space with calcification, although  the calcification is atypical. Generalized cerebral volume loss. On postcontrast imaging there is faint dural based nodular enhancement along the inferior right frontal lobe measuring 8 mm. Vascular:  See below Skull: Normal. Negative for fracture or focal lesion. Sinuses: Imaged portions are clear. Orbits: No acute finding. Review of the MIP images confirms the above findings CTA NECK FINDINGS Aortic arch: Atheromatous plaque with 3 vessel branching. No acute finding. Right carotid system: Mild plaque at the bifurcation without stenosis or ulceration. Additional calcified plaque at the distal segment of the ICA. Left carotid system: Atheromatous plaque at the bifurcation without significant stenosis or ulceration. Vertebral arteries: No proximal subclavian stenosis. Calcified plaque at the left vertebral origin with only mild narrowing. Both vertebral arteries are widely patent to the dura. Skeleton: Generalized cervical spine degeneration with C6-7 and C7-T1 anterolisthesis. Other neck: No acute finding Upper chest: Fluid distended esophagus possibly related to history of vomiting. No similar appearance on recent abdominal CT 05/04/2021 Review of the MIP images confirms the above findings CTA HEAD FINDINGS Anterior circulation: Atheromatous calcification of the carotid siphons. No flow limiting stenosis, branch occlusion, beading, or aneurysm. Posterior circulation: Mild calcified plaque on the left V4 segment. The vertebral and basilar arteries are smoothly contoured and widely patent. Negative for aneurysm or vascular malformation Venous sinuses: Negative Anatomic variants: None significant Review of the MIP images confirms the above findings IMPRESSION: 1. No emergent finding vascular finding. Mild for age atherosclerosis. 2. Fluid distended esophagus where seen in the upper chest. 3. Branching low-density in the ventral right brain, likely dilated perivascular space although associated with calcification which  is uncommon. Attention on any planned brain MRI. 4. Presumed meningioma along the floor of the right anterior cranial fossa measuring 8 mm, incidental. Electronically Signed   By: Jorje Guild M.D.   On: 06/17/2021 05:43   MR BRAIN WO CONTRAST  Result Date: 06/17/2021 CLINICAL DATA:  Nausea and vomiting today, stroke suspected EXAM: MRI HEAD WITHOUT CONTRAST TECHNIQUE: Multiplanar, multiecho pulse sequences of the brain and surrounding structures were obtained without intravenous contrast. COMPARISON:  CT/CTA head and neck obtained earlier the same day FINDINGS: Brain: There is a cystic area in the right basal ganglia corresponding to the hypodensity seen on the prior CT measuring approximally 1.4 cm x 1.0 cm. There is a rim of T2 hypointensity with significant blooming artifact on the SWI sequence. There is curvilinear diffusion restriction along the posterior margin. A separate cystic area in the anterior  limb of the internal capsule is favored to reflect a dilated perivascular space. There is no evidence of acute intracranial hemorrhage or extra-axial fluid collection. There is no evidence of acute territorial infarct. There is a background of moderate global parenchymal volume loss with prominence of the ventricular system and extra-axial CSF spaces. Patchy FLAIR signal abnormality in the subcortical and periventricular white matter likely reflects sequela of mild chronic white matter microangiopathy. There is no solid mass lesion.  There is no midline shift. Vascular: Normal flow voids. Skull and upper cervical spine: Normal marrow signal. Sinuses/Orbits: The paranasal sinuses are clear. The globes and orbits are unremarkable. Other: None. IMPRESSION: 1. No evidence of acute intracranial pathology. 2. 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 There is no evidence of superimposed acute hemorrhage, and no  surrounding edema or mass effect. Electronically Signed   By: Valetta Mole M.D.   On: 06/17/2021 08:17    DVT Prophylaxis -SCD  AM Labs Ordered, also please review Full Orders  Family Communication: Admission, patients condition and plan of care including tests being ordered have been discussed with the patient and son Olivia Mackie who indicate understanding and agree with the plan   Code Status - Full Code  Likely DC to home  Condition   -stable  Roxan Hockey M.D on 06/17/2021 at 7:33 PM Go to [email or web address] -  for contact info  [facility] - Office  [phone]

## 2021-06-18 ENCOUNTER — Encounter (HOSPITAL_COMMUNITY)
Admission: EM | Disposition: A | Discharge status: Home / Self Care | Payer: Self-pay | Source: Home / Self Care | Attending: Family Medicine

## 2021-06-18 ENCOUNTER — Observation Stay (HOSPITAL_COMMUNITY): Payer: Medicare Other | Admitting: Anesthesiology

## 2021-06-18 DIAGNOSIS — K269 Duodenal ulcer, unspecified as acute or chronic, without hemorrhage or perforation: Secondary | ICD-10-CM | POA: Diagnosis not present

## 2021-06-18 DIAGNOSIS — N400 Enlarged prostate without lower urinary tract symptoms: Secondary | ICD-10-CM | POA: Diagnosis not present

## 2021-06-18 DIAGNOSIS — K2970 Gastritis, unspecified, without bleeding: Secondary | ICD-10-CM | POA: Diagnosis not present

## 2021-06-18 DIAGNOSIS — I2583 Coronary atherosclerosis due to lipid rich plaque: Secondary | ICD-10-CM | POA: Diagnosis not present

## 2021-06-18 DIAGNOSIS — I2510 Atherosclerotic heart disease of native coronary artery without angina pectoris: Secondary | ICD-10-CM | POA: Diagnosis not present

## 2021-06-18 DIAGNOSIS — K2289 Other specified disease of esophagus: Secondary | ICD-10-CM | POA: Diagnosis not present

## 2021-06-18 DIAGNOSIS — I10 Essential (primary) hypertension: Secondary | ICD-10-CM | POA: Diagnosis not present

## 2021-06-18 DIAGNOSIS — K2980 Duodenitis without bleeding: Secondary | ICD-10-CM | POA: Diagnosis not present

## 2021-06-18 DIAGNOSIS — R1310 Dysphagia, unspecified: Secondary | ICD-10-CM | POA: Diagnosis present

## 2021-06-18 DIAGNOSIS — K224 Dyskinesia of esophagus: Secondary | ICD-10-CM | POA: Diagnosis not present

## 2021-06-18 DIAGNOSIS — Z20822 Contact with and (suspected) exposure to covid-19: Secondary | ICD-10-CM | POA: Diagnosis not present

## 2021-06-18 DIAGNOSIS — K219 Gastro-esophageal reflux disease without esophagitis: Secondary | ICD-10-CM | POA: Diagnosis not present

## 2021-06-18 DIAGNOSIS — K317 Polyp of stomach and duodenum: Secondary | ICD-10-CM

## 2021-06-18 DIAGNOSIS — K117 Disturbances of salivary secretion: Secondary | ICD-10-CM | POA: Diagnosis not present

## 2021-06-18 DIAGNOSIS — R609 Edema, unspecified: Secondary | ICD-10-CM | POA: Diagnosis not present

## 2021-06-18 DIAGNOSIS — K222 Esophageal obstruction: Secondary | ICD-10-CM | POA: Diagnosis not present

## 2021-06-18 DIAGNOSIS — I350 Nonrheumatic aortic (valve) stenosis: Secondary | ICD-10-CM | POA: Diagnosis not present

## 2021-06-18 DIAGNOSIS — R1314 Dysphagia, pharyngoesophageal phase: Secondary | ICD-10-CM | POA: Diagnosis not present

## 2021-06-18 DIAGNOSIS — J45909 Unspecified asthma, uncomplicated: Secondary | ICD-10-CM | POA: Diagnosis not present

## 2021-06-18 DIAGNOSIS — T18128A Food in esophagus causing other injury, initial encounter: Secondary | ICD-10-CM | POA: Diagnosis not present

## 2021-06-18 DIAGNOSIS — D131 Benign neoplasm of stomach: Secondary | ICD-10-CM | POA: Diagnosis not present

## 2021-06-18 DIAGNOSIS — K449 Diaphragmatic hernia without obstruction or gangrene: Secondary | ICD-10-CM | POA: Diagnosis not present

## 2021-06-18 DIAGNOSIS — T18108A Unspecified foreign body in esophagus causing other injury, initial encounter: Secondary | ICD-10-CM | POA: Diagnosis not present

## 2021-06-18 DIAGNOSIS — D649 Anemia, unspecified: Secondary | ICD-10-CM | POA: Diagnosis not present

## 2021-06-18 DIAGNOSIS — R011 Cardiac murmur, unspecified: Secondary | ICD-10-CM | POA: Diagnosis not present

## 2021-06-18 DIAGNOSIS — E7800 Pure hypercholesterolemia, unspecified: Secondary | ICD-10-CM | POA: Diagnosis not present

## 2021-06-18 DIAGNOSIS — D329 Benign neoplasm of meninges, unspecified: Secondary | ICD-10-CM | POA: Diagnosis not present

## 2021-06-18 DIAGNOSIS — F0390 Unspecified dementia without behavioral disturbance: Secondary | ICD-10-CM | POA: Diagnosis not present

## 2021-06-18 DIAGNOSIS — X58XXXA Exposure to other specified factors, initial encounter: Secondary | ICD-10-CM | POA: Diagnosis present

## 2021-06-18 HISTORY — PX: ESOPHAGEAL DILATION: SHX303

## 2021-06-18 HISTORY — PX: ESOPHAGOGASTRODUODENOSCOPY (EGD) WITH PROPOFOL: SHX5813

## 2021-06-18 LAB — BASIC METABOLIC PANEL
Anion gap: 6 (ref 5–15)
BUN: 26 mg/dL — ABNORMAL HIGH (ref 8–23)
CO2: 24 mmol/L (ref 22–32)
Calcium: 8.3 mg/dL — ABNORMAL LOW (ref 8.9–10.3)
Chloride: 109 mmol/L (ref 98–111)
Creatinine, Ser: 1.01 mg/dL (ref 0.61–1.24)
GFR, Estimated: >60 mL/min (ref >60)
Glucose, Bld: 119 mg/dL — ABNORMAL HIGH (ref 70–99)
Potassium: 3.8 mmol/L (ref 3.5–5.1)
Sodium: 139 mmol/L (ref 135–145)

## 2021-06-18 LAB — CBC
HCT: 37.6 % — ABNORMAL LOW (ref 39.0–52.0)
Hemoglobin: 12.3 g/dL — ABNORMAL LOW (ref 13.0–17.0)
MCH: 29.1 pg (ref 26.0–34.0)
MCHC: 32.7 g/dL (ref 30.0–36.0)
MCV: 88.9 fL (ref 80.0–100.0)
Platelets: 233 10*3/uL (ref 150–400)
RBC: 4.23 MIL/uL (ref 4.22–5.81)
RDW: 14 % (ref 11.5–15.5)
WBC: 9 10*3/uL (ref 4.0–10.5)
nRBC: 0 % (ref 0.0–0.2)

## 2021-06-18 SURGERY — ESOPHAGOGASTRODUODENOSCOPY (EGD) WITH PROPOFOL
Anesthesia: General

## 2021-06-18 MED ORDER — LIDOCAINE HCL (CARDIAC) PF 100 MG/5ML IV SOSY
PREFILLED_SYRINGE | INTRAVENOUS | Status: DC | PRN
  Administered 2021-06-18: 50 mL via INTRATRACHEAL

## 2021-06-18 MED ORDER — PANTOPRAZOLE SODIUM 40 MG IV SOLR
40.0000 mg | Freq: Two times a day (BID) | INTRAVENOUS | Status: DC
  Administered 2021-06-18 – 2021-06-19 (×3): 40 mg via INTRAVENOUS
  Filled 2021-06-18 (×3): qty 10

## 2021-06-18 MED ORDER — PROPOFOL 10 MG/ML IV BOLUS
INTRAVENOUS | Status: DC | PRN
  Administered 2021-06-18: 50 mg via INTRAVENOUS
  Administered 2021-06-18 (×2): 20 mg via INTRAVENOUS

## 2021-06-18 MED ORDER — PROPOFOL 10 MG/ML IV BOLUS
INTRAVENOUS | Status: AC
  Filled 2021-06-18: qty 20

## 2021-06-18 MED ORDER — LORAZEPAM 2 MG/ML IJ SOLN
1.0000 mg | Freq: Once | INTRAMUSCULAR | Status: AC
Start: 2021-06-18 — End: 2021-06-18
  Administered 2021-06-18: 1 mg via INTRAVENOUS
  Filled 2021-06-18: qty 1

## 2021-06-18 MED ORDER — LACTATED RINGERS IV SOLN: INTRAVENOUS | Status: DC | PRN

## 2021-06-18 MED ORDER — SODIUM CHLORIDE 0.9 % IV SOLN: INTRAVENOUS | Status: DC

## 2021-06-18 MED ORDER — EPHEDRINE SULFATE-NACL 50-0.9 MG/10ML-% IV SOSY
PREFILLED_SYRINGE | INTRAVENOUS | Status: DC | PRN
  Administered 2021-06-18: 5 mg via INTRAVENOUS

## 2021-06-18 MED ORDER — LIDOCAINE HCL (PF) 2 % IJ SOLN
INTRAMUSCULAR | Status: AC
  Filled 2021-06-18: qty 5

## 2021-06-18 MED ORDER — LABETALOL HCL 5 MG/ML IV SOLN
10.0000 mg | INTRAVENOUS | Status: DC | PRN
Start: 2021-06-18 — End: 2021-06-19
  Administered 2021-06-18: 10 mg via INTRAVENOUS
  Filled 2021-06-18: qty 4

## 2021-06-18 MED ORDER — ISOSORBIDE MONONITRATE 10 MG PO TABS
5.0000 mg | ORAL_TABLET | Freq: Three times a day (TID) | ORAL | Status: DC
Start: 2021-06-18 — End: 2021-06-19
  Administered 2021-06-18 – 2021-06-19 (×3): 5 mg via ORAL
  Filled 2021-06-18 (×7): qty 0.5

## 2021-06-18 MED ORDER — PROPOFOL 500 MG/50ML IV EMUL
INTRAVENOUS | Status: DC | PRN
Start: 2021-06-18 — End: 2021-06-18
  Administered 2021-06-18: 100 ug/kg/min via INTRAVENOUS

## 2021-06-18 NOTE — Op Note (Signed)
[facility] Patient Name: Ryan Hansen Procedure Date: 06/18/2021 8:26 AM MRN: [id number] Date of Birth: 1928/05/15 Attending MD: Hildred Laser , MD CSN: [id number] Age: 88 Admit Type: Inpatient Procedure:                Upper GI endoscopy Indications:              Esophageal dysphagia, Foreign body in the esophagus Providers:                Hildred Laser, MD, Lurline Del, RN, Bonnetta Barry,                            Technician Referring MD:             Roxan Hockey, MD Medicines:                Propofol per Anesthesia Complications:            No immediate complications. Estimated Blood Loss:     Estimated blood loss was minimal. Procedure:                Pre-Anesthesia Assessment:                           - Prior to the procedure, a History and Physical                            was performed, and patient medications and                            allergies were reviewed. The patient's tolerance of                            previous anesthesia was also reviewed. The risks                            and benefits of the procedure and the sedation                            options and risks were discussed with the patient.                            All questions were answered, and informed consent                            was obtained. Prior Anticoagulants: The patient has                            taken no previous anticoagulant or antiplatelet                            agents. ASA Grade Assessment: III - A patient with                            severe systemic disease. After reviewing the risks  and benefits, the patient was deemed in                            satisfactory condition to undergo the procedure.                           After obtaining informed consent, the endoscope was                            passed under direct vision. Throughout the                            procedure, the patient's blood pressure, pulse, and                             oxygen saturations were monitored continuously. The                            [id number]-[id number] ([id number]) scope was introduced through the                            mouth, and advanced to the second part of duodenum.                            The upper GI endoscopy was accomplished without                            difficulty. The patient tolerated the procedure                            well. Scope In: 9:04:54 AM Scope Out: 9:21:13 AM Total Procedure Duration: 0 hours 16 minutes 19 seconds  Findings:      The hypopharynx was normal.      Food was found in the mid esophagus and in the distal esophagus. Removal       of food was accomplished.      Abnormal motility was noted in the esophagus.      One benign-appearing, intrinsic mild stenosis was found. The stenosis       was traversed. The dilation site was examined and showed no change.      A 2 cm hiatal hernia was present.      A single small sessile polyp with no stigmata of recent bleeding was       found in the gastric body.      Patchy mild inflammation characterized by adherent blood, congestion       (edema) and erythema was found in the gastric antrum and at the pylorus.      Multiple erosions without bleeding were found in the duodenal bulb and       in the second portion of the duodenum. Impression:               - Normal hypopharynx.                           - Food in the mid esophagus and in the distal  esophagus. Removal was successful.                           - Abnormal esophageal motility.                           - Benign-appearing esophageal stenosis.                           - 2 cm hiatal hernia.                           - A single gastric polyp.                           - Gastritis.                           - Duodenal erosions without bleeding. Moderate Sedation:      Per Anesthesia Care Recommendation:           - Return patient to hospital ward for ongoing care.                            - Clear liquid diet today.                           - Continue present medications.                           -Isosorbide mononitrate 5 mg by mouth 30 minutes                            before each meal                           - Perform an esophagram at appointment to be                            scheduled.                           - Repeat upper endoscopy after esophagogram                            completed are complete.                           - Hold heparin for 72 hours. Procedure Code(s):        --- Professional ---                           [id number], Esophagogastroduodenoscopy, flexible,                            transoral; with removal of foreign body(s) Diagnosis Code(s):        --- Professional ---  Z30.076A, Food in esophagus causing other injury,                            initial encounter                           K22.4, Dyskinesia of esophagus                           K22.2, Esophageal obstruction                           K44.9, Diaphragmatic hernia without obstruction or                            gangrene                           K31.7, Polyp of stomach and duodenum                           K29.70, Gastritis, unspecified, without bleeding                           K26.9, Duodenal ulcer, unspecified as acute or                            chronic, without hemorrhage or perforation                           R13.14, Dysphagia, pharyngoesophageal phase                           T18.108A, Unspecified foreign body in esophagus                            causing other injury, initial encounter CPT copyright 2019 [employer]. All rights reserved. The codes documented in this report are preliminary and upon coder review may  be revised to meet current compliance requirements. Hildred Laser, MD Hildred Laser, MD 06/18/2021 9:38:54 AM This report has been signed electronically. Number of Addenda: 0

## 2021-06-18 NOTE — Progress Notes (Addendum)
Brief EGD note.  Air-fluid level esophagus suggesting distal obstruction secondary to food debris which look like scrambled egg. Esophageal dysmotility. Foreign body removed distally without any difficulty. Soft stricture at GE junction dilated to 15 mm. 2 cm sliding hiatal hernia. Small gastric polyp at body.  Was left alone. Antral gastritis with pyloric channel inflammation. Bulbar and post bulbar duodenitis with multiple erosions.

## 2021-06-18 NOTE — Anesthesia Preprocedure Evaluation (Addendum)
Anesthesia Evaluation  Patient identified by MRN, date of birth, ID band Patient awake    Reviewed: Allergy & Precautions, NPO status , Patient's Chart, lab work & pertinent test results  Airway Mallampati: II  TM Distance: >3 FB Neck ROM: Full    Dental  (+) Upper Dentures, Lower Dentures   Pulmonary asthma ,    Pulmonary exam normal breath sounds clear to auscultation       Cardiovascular Exercise Tolerance: Good hypertension, Pt. on medications + CAD  Normal cardiovascular exam+ Valvular Problems/Murmurs  Rhythm:Regular Rate:Bradycardia     Neuro/Psych negative neurological ROS  negative psych ROS   GI/Hepatic Neg liver ROS, GERD  Medicated,  Endo/Other  negative endocrine ROS  Renal/GU negative Renal ROS  negative genitourinary   Musculoskeletal negative musculoskeletal ROS (+)   Abdominal   Peds negative pediatric ROS (+)  Hematology negative hematology ROS (+)   Anesthesia Other Findings   Reproductive/Obstetrics negative OB ROS                            Anesthesia Physical Anesthesia Plan  ASA: 3  Anesthesia Plan: General   Post-op Pain Management: Minimal or no pain anticipated   Induction: Intravenous  PONV Risk Score and Plan: 1  Airway Management Planned: Nasal Cannula and Natural Airway  Additional Equipment:   Intra-op Plan:   Post-operative Plan: Possible Post-op intubation/ventilation  Informed Consent: I have reviewed the patients History and Physical, chart, labs and discussed the procedure including the risks, benefits and alternatives for the proposed anesthesia with the patient or authorized representative who has indicated his/her understanding and acceptance.       Plan Discussed with: Surgeon  Anesthesia Plan Comments:         Anesthesia Quick Evaluation

## 2021-06-18 NOTE — Anesthesia Postprocedure Evaluation (Signed)
Anesthesia Post Note  Patient: Ryan Hansen  Procedure(s) Performed: ESOPHAGOGASTRODUODENOSCOPY (EGD) WITH PROPOFOL ESOPHAGEAL DILATION  Patient location during evaluation: Nursing Unit Anesthesia Type: General Level of consciousness: awake and alert and oriented Pain management: pain level controlled Vital Signs Assessment: post-procedure vital signs reviewed and stable Respiratory status: spontaneous breathing and respiratory function stable Cardiovascular status: blood pressure returned to baseline and stable Postop Assessment: no apparent nausea or vomiting Anesthetic complications: no   No notable events documented.   Last Vitals:  Vitals:   06/18/21 1001 06/18/21 1027  BP: (!) 146/59 (!) 157/67  Pulse: (!) 55 (!) 56  Resp: 19 18  Temp:  (!) 36.2 C  SpO2: 99% 97%    Last Pain:  Vitals:   06/18/21 1027  TempSrc: Axillary  PainSc:                  Willliam Pettet C Rachard Isidro

## 2021-06-18 NOTE — Transfer of Care (Signed)
Immediate Anesthesia Transfer of Care Note  Patient: Tawny Asal  Procedure(s) Performed: ESOPHAGOGASTRODUODENOSCOPY (EGD) WITH PROPOFOL ESOPHAGEAL DILATION  Patient Location: PACU  Anesthesia Type:General  Level of Consciousness: awake, sedated and drowsy  Airway & Oxygen Therapy: Patient Spontanous Breathing and Patient connected to nasal cannula oxygen  Post-op Assessment: Report given to RN and Post -op Vital signs reviewed and stable  Post vital signs: Reviewed and stable  Last Vitals:  Vitals Value Taken Time  BP 112/53 06/18/21 0934  Temp 98.5   Pulse 50 06/18/21 0937  Resp 50 06/18/21 0937  SpO2 100 % 06/18/21 0937  Vitals shown include unvalidated device data.  Last Pain:  Vitals:   06/18/21 0817  TempSrc: Oral  PainSc:          Complications: No notable events documented.

## 2021-06-18 NOTE — Progress Notes (Addendum)
PROGRESS NOTE     Ryan Hansen, is a 88 y.o. male, DOB - 1929/02/13, WNI:[id number]  Admit date - 06/17/2021   Admitting Physician Ngan Qualls Denton Brick, MD  Outpatient Primary MD for the patient is Lemmie Evens, MD  LOS - 0  Chief Complaint  Patient presents with   Nausea   Emesis        Brief Narrative:   88 y.o. male history of CAD, aortic stenosis, asthma, HTN, HLD, GERD and obvious cognitive and memory deficits admitted on 06/17/2021 with Dysphagia and Sialorrhea --due to food impaction/esophageal stenosis/ stricture--    -Assessment and Plan:  1)Dysphagia and Sialorrhea --due to food impaction/esophageal stenosis/ stricture-- -Failed trial of glucagon EGD on 06/18/21 shows Air-fluid level esophagus suggesting distal obstruction secondary to food debris which look like scrambled egg. Esophageal dysmotility. Foreign body removed distally without any difficulty. Soft stricture at GE junction dilated to 15 mm. 2 cm sliding hiatal hernia. Small gastric polyp at body.  Was left alone. Antral gastritis with pyloric channel inflammation. Bulbar and post bulbar duodenitis with multiple erosions. -Start isosorbide 5 mg 3 times daily with meals -Continue Protonix -Patient will need an esophagram and repeat EGD -Try liquid diet -Continue dextrose solution until oral intake more reliable  2)CAD/HTN--stable, continue simvastatin, -Okay to resume aspirin -Continue Toprol-XL with hold parameters  3)BPH--continue Proscar  4)Asthma--- well without exacerbation, continue bronchodilators  5)Dementia--patient with obvious cognitive and memory deficits -Patient's son reports that this is not new but has been getting worse lately -Patient lives alone and continues to drive -Advised further evaluation by neurology or PCP -Leaning  strongly towards patient no longer driving  Disposition/Need for in-Hospital Stay- patient unable to be discharged at this time due to --esophageal stenosis  with food impaction-patient requires IV dextrose solution/fluids pending tolerance of oral intake  Status is: Inpatient   Disposition: The patient is from: Home              Anticipated d/c is to: Home              Anticipated d/c date is: 2 days              Patient currently is not medically stable to d/c. Barriers: Not Clinically Stable-   Code Status :  -  Code Status: Full Code   Family Communication:    Discussed with son Olivia Mackie   DVT Prophylaxis  :   - SCDs   SCDs Start: 06/17/21 1936 Place TED hose Start: 06/17/21 1936   Lab Results  Component Value Date   PLT 233 06/18/2021    Inpatient Medications  Scheduled Meds:  finasteride  5 mg Oral Daily   isosorbide mononitrate  5 mg Oral TID AC   metoprolol succinate  25 mg Oral q morning   mometasone-formoterol  2 puff Inhalation BID   pantoprazole (PROTONIX) IV  40 mg Intravenous Q12H   simvastatin  40 mg Oral q1800   sodium chloride flush  3 mL Intravenous Q12H   sodium chloride flush  3 mL Intravenous Q12H   traZODone  100 mg Oral QHS   Continuous Infusions:  sodium chloride     dextrose 5 % and 0.45% NaCl 100 mL/hr at 06/18/21 0242   PRN Meds:.sodium chloride, acetaminophen **OR** acetaminophen, bisacodyl, labetalol, LORazepam, ondansetron **OR** ondansetron (ZOFRAN) IV, polyethylene glycol, sodium chloride flush   Anti-infectives (From admission, onward)    None       Subjective: Ryan Hansen today has no fevers, no emesis,  No chest pain,   Resting comfortably after EGD  Objective: Vitals:   06/18/21 0935 06/18/21 0945 06/18/21 1001 06/18/21 1027  BP: (!) 112/53 140/77 (!) 146/59 (!) 157/67  Pulse: (!) 51 (!) 56 (!) 55 (!) 56  Resp: 17 19 19 18   Temp: 98.5 F (36.9 C)   (!) 97.2 F (36.2 C)  TempSrc:    Axillary  SpO2: 100% 99% 99% 97%  Weight:      Height:        Intake/Output Summary (Last 24 hours) at 06/18/2021 1110 Last data filed at 06/18/2021 4008 Gross per 24 hour  Intake 1417.45 ml   Output 150 ml  Net 1267.45 ml   Filed Weights   06/17/21 0246  Weight: 68 kg    Physical Exam  Gen:- Awake Alert,  in no apparent distress  HEENT:- [place].AT, No sclera icterus Neck-Supple Neck,No JVD,.  Lungs-  CTAB , fair symmetrical air movement CV- S1, S2 normal, regular  Abd-  +ve B.Sounds, Abd Soft, No tenderness,    Extremity/Skin:- No  edema, pedal pulses present  Psych-baseline/underlying memory and cognitive deficits consistent with dementia  -neuro-no new focal deficits, no tremors  Data Reviewed: I have personally reviewed following labs and imaging studies  CBC: Recent Labs  Lab 06/17/21 0436 06/17/21 0453 06/18/21 0412  WBC 9.3  --  9.0  NEUTROABS 6.9  --   --   HGB 14.4 15.0 12.3*  HCT 44.6 44.0 37.6*  MCV 88.5  --  88.9  PLT 274  --  676   Basic Metabolic Panel: Recent Labs  Lab 06/17/21 0436 06/17/21 0453 06/18/21 0412  NA 140 142 139  K 4.0 4.0 3.8  CL 106 107 109  CO2 26  --  24  GLUCOSE 90 92 119*  BUN 27* 26* 26*  CREATININE 1.12 1.00 1.01  CALCIUM 9.1  --  8.3*   GFR: Estimated Creatinine Clearance: 46.8 mL/min (by C-G formula based on SCr of 1.01 mg/dL). Liver Function Tests: Recent Labs  Lab 06/17/21 0436  AST 15  ALT 13  ALKPHOS 60  BILITOT 0.9  PROT 8.7*  ALBUMIN 4.1   Cardiac Enzymes: No results for input(s): CKTOTAL, CKMB, CKMBINDEX, TROPONINI in the last 168 hours. BNP (last 3 results) No results for input(s): PROBNP in the last 8760 hours. HbA1C: No results for input(s): HGBA1C in the last 72 hours. Sepsis Labs: @LABRCNTIP (procalcitonin:4,lacticidven:4) ) Recent Results (from the past 240 hour(s))  Novel Coronavirus, NAA ([facility])     Status: None   Collection Time: 06/16/21  3:07 PM   Specimen: Nasopharyngeal Swab; Nasopharyngeal(NP) swabs in vial transport medium   Nasopharynge  Result Value Ref Range Status   SARS-CoV-2, NAA Not Detected Not Detected Final    Comment: This nucleic acid amplification test was  developed and its performance characteristics determined by [employer]. Nucleic acid amplification tests include RT-PCR and TMA. This test has not been FDA cleared or approved. This test has been authorized by FDA under an Emergency Use Authorization (EUA). This test is only authorized for the duration of time the declaration that circumstances exist justifying the authorization of the emergency use of in vitro diagnostic tests for detection of SARS-CoV-2 virus and/or diagnosis of COVID-19 infection under section 564(b)(1) of the Act, 21 U.S.C. 195KDT-2(I) (1), unless the authorization is terminated or revoked sooner. When diagnostic testing is negative, the possibility of a false negative result should be considered in the context of a patient's recent exposures and the  presence of clinical signs and symptoms consistent with COVID-19. An individual without symptoms of COVID-19 and who is not shedding SARS-CoV-2 virus wo uld expect to have a negative (not detected) result in this assay.   Resp Panel by RT-PCR (Flu A&B, Covid) Nasopharyngeal Swab     Status: None   Collection Time: 06/17/21  3:05 AM   Specimen: Nasopharyngeal Swab; Nasopharyngeal(NP) swabs in vial transport medium  Result Value Ref Range Status   SARS Coronavirus 2 by RT PCR NEGATIVE NEGATIVE Final    Comment: (NOTE) SARS-CoV-2 target nucleic acids are NOT DETECTED.  The SARS-CoV-2 RNA is generally detectable in upper respiratory specimens during the acute phase of infection. The lowest concentration of SARS-CoV-2 viral copies this assay can detect is 138 copies/mL. A negative result does not preclude SARS-Cov-2 infection and should not be used as the sole basis for treatment or other patient management decisions. A negative result may occur with  improper specimen collection/handling, submission of specimen other than nasopharyngeal swab, presence of viral mutation(s) within the areas targeted by this  assay, and inadequate number of viral copies(<138 copies/mL). A negative result must be combined with clinical observations, patient history, and epidemiological information. The expected result is Negative.  Fact Sheet for Patients:  EntrepreneurPulse.com.au  Fact Sheet for Healthcare Providers:  IncredibleEmployment.be  This test is no t yet approved or cleared by the [place] FDA and  has been authorized for detection and/or diagnosis of SARS-CoV-2 by FDA under an Emergency Use Authorization (EUA). This EUA will remain  in effect (meaning this test can be used) for the duration of the COVID-19 declaration under Section 564(b)(1) of the Act, 21 U.S.C.section 360bbb-3(b)(1), unless the authorization is terminated  or revoked sooner.       Influenza A by PCR NEGATIVE NEGATIVE Final   Influenza B by PCR NEGATIVE NEGATIVE Final    Comment: (NOTE) The Xpert Xpress SARS-CoV-2/FLU/RSV plus assay is intended as an aid in the diagnosis of influenza from Nasopharyngeal swab specimens and should not be used as a sole basis for treatment. Nasal washings and aspirates are unacceptable for Xpert Xpress SARS-CoV-2/FLU/RSV testing.  Fact Sheet for Patients: EntrepreneurPulse.com.au  Fact Sheet for Healthcare Providers: IncredibleEmployment.be  This test is not yet approved or cleared by the [place] FDA and has been authorized for detection and/or diagnosis of SARS-CoV-2 by FDA under an Emergency Use Authorization (EUA). This EUA will remain in effect (meaning this test can be used) for the duration of the COVID-19 declaration under Section 564(b)(1) of the Act, 21 U.S.C. section 360bbb-3(b)(1), unless the authorization is terminated or revoked.  Performed at [facility], [address] [place], [place] [address]       Radiology Studies: CT ANGIO HEAD NECK W WO CM  Result Date: 06/17/2021 CLINICAL  DATA:  Neuro deficit with acute stroke suspected. Nausea and vomiting at home EXAM: CT ANGIOGRAPHY HEAD AND NECK TECHNIQUE: Multidetector CT imaging of the head and neck was performed using the standard protocol during bolus administration of intravenous contrast. Multiplanar CT image reconstructions and MIPs were obtained to evaluate the vascular anatomy. Carotid stenosis measurements (when applicable) are obtained utilizing NASCET criteria, using the distal internal carotid diameter as the denominator. RADIATION DOSE REDUCTION: This exam was performed according to the departmental dose-optimization program which includes automated exposure control, adjustment of the mA and/or kV according to patient size and/or use of iterative reconstruction technique. CONTRAST:  1mL OMNIPAQUE IOHEXOL 350 MG/ML SOLN COMPARISON:  None. FINDINGS: CT HEAD  FINDINGS Brain: No evidence of acute infarction, hemorrhage, hydrocephalus, extra-axial collection or mass lesion/mass effect. Branching low-density along the lower right brain with adjacent mineralization, favor dilated perivascular space with calcification, although the calcification is atypical. Generalized cerebral volume loss. On postcontrast imaging there is faint dural based nodular enhancement along the inferior right frontal lobe measuring 8 mm. Vascular:  See below Skull: Normal. Negative for fracture or focal lesion. Sinuses: Imaged portions are clear. Orbits: No acute finding. Review of the MIP images confirms the above findings CTA NECK FINDINGS Aortic arch: Atheromatous plaque with 3 vessel branching. No acute finding. Right carotid system: Mild plaque at the bifurcation without stenosis or ulceration. Additional calcified plaque at the distal segment of the ICA. Left carotid system: Atheromatous plaque at the bifurcation without significant stenosis or ulceration. Vertebral arteries: No proximal subclavian stenosis. Calcified plaque at the left vertebral origin with  only mild narrowing. Both vertebral arteries are widely patent to the dura. Skeleton: Generalized cervical spine degeneration with C6-7 and C7-T1 anterolisthesis. Other neck: No acute finding Upper chest: Fluid distended esophagus possibly related to history of vomiting. No similar appearance on recent abdominal CT 05/04/2021 Review of the MIP images confirms the above findings CTA HEAD FINDINGS Anterior circulation: Atheromatous calcification of the carotid siphons. No flow limiting stenosis, branch occlusion, beading, or aneurysm. Posterior circulation: Mild calcified plaque on the left V4 segment. The vertebral and basilar arteries are smoothly contoured and widely patent. Negative for aneurysm or vascular malformation Venous sinuses: Negative Anatomic variants: None significant Review of the MIP images confirms the above findings IMPRESSION: 1. No emergent finding vascular finding. Mild for age atherosclerosis. 2. Fluid distended esophagus where seen in the upper chest. 3. Branching low-density in the ventral right brain, likely dilated perivascular space although associated with calcification which is uncommon. Attention on any planned brain MRI. 4. Presumed meningioma along the floor of the right anterior cranial fossa measuring 8 mm, incidental. Electronically Signed   By: Jorje Guild M.D.   On: 06/17/2021 05:43   MR BRAIN WO CONTRAST  Result Date: 06/17/2021 CLINICAL DATA:  Nausea and vomiting today, stroke suspected EXAM: MRI HEAD WITHOUT CONTRAST TECHNIQUE: Multiplanar, multiecho pulse sequences of the brain and surrounding structures were obtained without intravenous contrast. COMPARISON:  CT/CTA head and neck obtained earlier the same day FINDINGS: Brain: There is a cystic area in the right basal ganglia corresponding to the hypodensity seen on the prior CT measuring approximally 1.4 cm x 1.0 cm. There is a rim of T2 hypointensity with significant blooming artifact on the SWI sequence. There is  curvilinear diffusion restriction along the posterior margin. A separate cystic area in the anterior limb of the internal capsule is favored to reflect a dilated perivascular space. There is no evidence of acute intracranial hemorrhage or extra-axial fluid collection. There is no evidence of acute territorial infarct. There is a background of moderate global parenchymal volume loss with prominence of the ventricular system and extra-axial CSF spaces. Patchy FLAIR signal abnormality in the subcortical and periventricular white matter likely reflects sequela of mild chronic white matter microangiopathy. There is no solid mass lesion.  There is no midline shift. Vascular: Normal flow voids. Skull and upper cervical spine: Normal marrow signal. Sinuses/Orbits: The paranasal sinuses are clear. The globes and orbits are unremarkable. Other: None. IMPRESSION: 1. No evidence of acute intracranial pathology. 2. 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  There is no evidence of superimposed acute hemorrhage, and no surrounding edema or mass effect. Electronically Signed   By: Valetta Mole M.D.   On: 06/17/2021 08:17     Scheduled Meds:  finasteride  5 mg Oral Daily   isosorbide mononitrate  5 mg Oral TID AC   metoprolol succinate  25 mg Oral q morning   mometasone-formoterol  2 puff Inhalation BID   pantoprazole (PROTONIX) IV  40 mg Intravenous Q12H   simvastatin  40 mg Oral q1800   sodium chloride flush  3 mL Intravenous Q12H   sodium chloride flush  3 mL Intravenous Q12H   traZODone  100 mg Oral QHS   Continuous Infusions:  sodium chloride     dextrose 5 % and 0.45% NaCl 100 mL/hr at 06/18/21 0242     LOS: 0 days    Roxan Hockey M.D on 06/18/2021 at 11:10 AM  Go to [email or web address] - for contact info  [facility] - Office  [phone]  If 7PM-7AM, please contact  night-coverage [email or web address] Password [email or web address] 06/18/2021, 11:10 AM

## 2021-06-18 NOTE — Progress Notes (Signed)
Subjective:  Patient has no complaints this morning.  He is able to swallow saliva now.  He denies chest pain shortness of breath or abdominal pain.  Current Medications:  Current Facility-Administered Medications:    [MAR Hold] 0.9 %  sodium chloride infusion, 250 mL, Intravenous, PRN, Denton Brick, Courage, MD   [MAR Hold] acetaminophen (TYLENOL) tablet 650 mg, 650 mg, Oral, Q6H PRN **OR** [MAR Hold] acetaminophen (TYLENOL) suppository 650 mg, 650 mg, Rectal, Q6H PRN, Denton Brick, Courage, MD   [MAR Hold] bisacodyl (DULCOLAX) suppository 10 mg, 10 mg, Rectal, Daily PRN, Emokpae, Courage, MD   dextrose 5 %-0.45 % sodium chloride infusion, , Intravenous, Continuous, Emokpae, Courage, MD, Last Rate: 100 mL/hr at 06/18/21 0242, New Bag at 06/18/21 0242   [MAR Hold] finasteride (PROSCAR) tablet 5 mg, 5 mg, Oral, Daily, Emokpae, Courage, MD   [facility] Hold] heparin injection 5,000 Units, 5,000 Units, Subcutaneous, Q8H, Emokpae, Courage, MD   [MAR Hold] labetalol (NORMODYNE) injection 10 mg, 10 mg, Intravenous, Q4H PRN, Denton Brick, Courage, MD, 10 mg at 06/18/21 0833   [MAR Hold] LORazepam (ATIVAN) injection 0.5 mg, 0.5 mg, Intravenous, Q12H PRN, Denton Brick, Courage, MD   [MAR Hold] metoprolol succinate (TOPROL-XL) 24 hr tablet 25 mg, 25 mg, Oral, q morning, Emokpae, Courage, MD   [MAR Hold] mometasone-formoterol (DULERA) 200-5 MCG/ACT inhaler 2 puff, 2 puff, Inhalation, BID, Emokpae, Courage, MD, 2 puff at 06/18/21 0813   [MAR Hold] ondansetron (ZOFRAN) tablet 4 mg, 4 mg, Oral, Q6H PRN **OR** [MAR Hold] ondansetron (ZOFRAN) injection 4 mg, 4 mg, Intravenous, Q6H PRN, Denton Brick, Courage, MD, 4 mg at 06/17/21 2044   [facility] Hold] polyethylene glycol (MIRALAX / GLYCOLAX) packet 17 g, 17 g, Oral, Daily PRN, Denton Brick, Courage, MD   [MAR Hold] simvastatin (ZOCOR) tablet 40 mg, 40 mg, Oral, q1800, Emokpae, Courage, MD, 40 mg at 06/17/21 2147   [facility] Hold] sodium chloride flush (NS) 0.9 % injection 3 mL, 3 mL, Intravenous, Q12H,  Emokpae, Courage, MD   [MAR Hold] sodium chloride flush (NS) 0.9 % injection 3 mL, 3 mL, Intravenous, Q12H, Emokpae, Courage, MD, 3 mL at 06/17/21 2149   [facility] Hold] sodium chloride flush (NS) 0.9 % injection 3 mL, 3 mL, Intravenous, PRN, Denton Brick, Courage, MD   [MAR Hold] traZODone (DESYREL) tablet 100 mg, 100 mg, Oral, QHS, Emokpae, Courage, MD, 100 mg at 06/17/21 2148   [MAR Hold] traZODone (DESYREL) tablet 50 mg, 50 mg, Oral, QHS PRN, Emokpae, Courage, MD  Objective: Blood pressure (!) 171/75, pulse 60, temperature 99.1 F (37.3 C), temperature source Oral, resp. rate 20, height $RemoveBe'5\' 9"'FFDAeflfK$  (1.753 m), weight 68 kg, SpO2 96 %. Patient is alert and in no acute distress. He knows he is in the hospital.  When asked who is the president of [place] he has had an old man. Conjunctiva is pink. Sclera is nonicteric Oropharyngeal mucosa is normal. He has partial upper and lower plate. No neck masses or thyromegaly noted. Cardiac exam with regular rhythm normal S1 and S2. No murmur or gallop noted. Lungs are clear to auscultation. Abdomen soft and nontender with organomegaly or masses. No LE edema or clubbing noted.  Labs/studies Results:   CBC Latest Ref Rng & Units 06/18/2021 06/17/2021 06/17/2021  WBC 4.0 - 10.5 K/uL 9.0 - 9.3  Hemoglobin 13.0 - 17.0 g/dL 12.3(L) 15.0 14.4  Hematocrit 39.0 - 52.0 % 37.6(L) 44.0 44.6  Platelets 150 - 400 K/uL 233 - 274    CMP Latest Ref Rng & Units 06/18/2021 06/17/2021 06/17/2021  Glucose 70 -  99 mg/dL 119(H) 92 90  BUN 8 - 23 mg/dL 26(H) 26(H) 27(H)  Creatinine 0.61 - 1.24 mg/dL 1.01 1.00 1.12  Sodium 135 - 145 mmol/L 139 142 140  Potassium 3.5 - 5.1 mmol/L 3.8 4.0 4.0  Chloride 98 - 111 mmol/L 109 107 106  CO2 22 - 32 mmol/L 24 - 26  Calcium 8.9 - 10.3 mg/dL 8.3(L) - 9.1  Total Protein 6.5 - 8.1 g/dL - - 8.7(H)  Total Bilirubin 0.3 - 1.2 mg/dL - - 0.9  Alkaline Phos 38 - 126 U/L - - 60  AST 15 - 41 U/L - - 15  ALT 0 - 44 U/L - - 13    Hepatic Function Latest  Ref Rng & Units 06/17/2021 08/20/2019 02/19/2014  Total Protein 6.5 - 8.1 g/dL 8.7(H) 7.5 7.1  Albumin 3.5 - 5.0 g/dL 4.1 4.1 4.1  AST 15 - 41 U/L $Remo'15 18 18  'JslkR$ ALT 0 - 44 U/L $Remo'13 11 10  'pSLFc$ Alk Phosphatase 38 - 126 U/L 60 69 54  Total Bilirubin 0.3 - 1.2 mg/dL 0.9 0.5 0.8  Bilirubin, Direct 0.0 - 0.3 mg/dL - - 0.2     Assessment:  #1.  Esophageal dysphagia.  Remains to be seen if he has stricture or motility disorder.  He probably had foreign body resulting in acute symptoms and it appears he has passed it as he can handle his own secretions.  #2.  Mild anemia.  This may be normal for his age.  No evidence of GI bleed.   Plan:  Proceed with diagnostic and therapeutic esophagogastroduodenoscopy.  Patient is agreeable.  I also talked with his son Aser Nylund who is a power of attorney and he is also agreeable to proceed with procedure. He will be contacted after the procedure.

## 2021-06-18 NOTE — Progress Notes (Signed)
Patient is asleep has been confused . Up in chair. Holding dulera for now.

## 2021-06-19 DIAGNOSIS — K224 Dyskinesia of esophagus: Secondary | ICD-10-CM | POA: Diagnosis not present

## 2021-06-19 DIAGNOSIS — T18108A Unspecified foreign body in esophagus causing other injury, initial encounter: Secondary | ICD-10-CM | POA: Diagnosis not present

## 2021-06-19 DIAGNOSIS — R1314 Dysphagia, pharyngoesophageal phase: Secondary | ICD-10-CM | POA: Diagnosis not present

## 2021-06-19 DIAGNOSIS — I2510 Atherosclerotic heart disease of native coronary artery without angina pectoris: Secondary | ICD-10-CM | POA: Diagnosis not present

## 2021-06-19 DIAGNOSIS — T18128A Food in esophagus causing other injury, initial encounter: Secondary | ICD-10-CM | POA: Diagnosis not present

## 2021-06-19 DIAGNOSIS — R1310 Dysphagia, unspecified: Secondary | ICD-10-CM | POA: Diagnosis not present

## 2021-06-19 DIAGNOSIS — I2583 Coronary atherosclerosis due to lipid rich plaque: Secondary | ICD-10-CM | POA: Diagnosis not present

## 2021-06-19 LAB — BASIC METABOLIC PANEL
Anion gap: 5 (ref 5–15)
BUN: 15 mg/dL (ref 8–23)
CO2: 25 mmol/L (ref 22–32)
Calcium: 8 mg/dL — ABNORMAL LOW (ref 8.9–10.3)
Chloride: 105 mmol/L (ref 98–111)
Creatinine, Ser: 0.93 mg/dL (ref 0.61–1.24)
GFR, Estimated: >60 mL/min (ref >60)
Glucose, Bld: 101 mg/dL — ABNORMAL HIGH (ref 70–99)
Potassium: 3.7 mmol/L (ref 3.5–5.1)
Sodium: 135 mmol/L (ref 135–145)

## 2021-06-19 LAB — CBC
HCT: 36 % — ABNORMAL LOW (ref 39.0–52.0)
Hemoglobin: 11.5 g/dL — ABNORMAL LOW (ref 13.0–17.0)
MCH: 28.2 pg (ref 26.0–34.0)
MCHC: 31.9 g/dL (ref 30.0–36.0)
MCV: 88.2 fL (ref 80.0–100.0)
Platelets: 180 10*3/uL (ref 150–400)
RBC: 4.08 MIL/uL — ABNORMAL LOW (ref 4.22–5.81)
RDW: 13.7 % (ref 11.5–15.5)
WBC: 8.8 10*3/uL (ref 4.0–10.5)
nRBC: 0 % (ref 0.0–0.2)

## 2021-06-19 MED ORDER — PANTOPRAZOLE SODIUM 40 MG PO TBEC: 40.0000 mg | DELAYED_RELEASE_TABLET | Freq: Every day | ORAL | Status: DC

## 2021-06-19 MED ORDER — ONDANSETRON 4 MG PO TBDP: 4.0000 mg | ORAL_TABLET | Freq: Three times a day (TID) | ORAL | 0 refills | Status: AC | PRN

## 2021-06-19 MED ORDER — LORAZEPAM 0.5 MG PO TABS: 0.5000 mg | ORAL_TABLET | Freq: Two times a day (BID) | ORAL | 0 refills | Status: AC | PRN

## 2021-06-19 MED ORDER — PROAIR HFA 108 (90 BASE) MCG/ACT IN AERS: 2.0000 | INHALATION_SPRAY | Freq: Four times a day (QID) | RESPIRATORY_TRACT | 1 refills | Status: AC | PRN

## 2021-06-19 MED ORDER — ISOSORBIDE MONONITRATE 10 MG PO TABS: 5.0000 mg | ORAL_TABLET | Freq: Three times a day (TID) | ORAL | 3 refills | Status: AC

## 2021-06-19 MED ORDER — PANTOPRAZOLE SODIUM 40 MG PO TBEC: 40.0000 mg | DELAYED_RELEASE_TABLET | Freq: Every day | ORAL | 3 refills | Status: AC

## 2021-06-19 MED ORDER — MIRTAZAPINE 15 MG PO TABS
7.5000 mg | ORAL_TABLET | Freq: Every day | ORAL | 3 refills | Status: AC
Start: 2021-06-19 — End: ?

## 2021-06-19 MED ORDER — BUDESONIDE-FORMOTEROL FUMARATE 160-4.5 MCG/ACT IN AERO: INHALATION_SPRAY | RESPIRATORY_TRACT | 5 refills | Status: AC

## 2021-06-19 NOTE — TOC Progression Note (Signed)
Transition of Care [facility]) - Progression Note    Patient Details  Name: SEANMICHAEL SALMONS MRN: [id number] Date of Birth: 1928-08-09  Transition of Care [facility]) CM/SW Contact  Salome Arnt, [place] Phone Number: 06/19/2021, 10:35 AM  Clinical Narrative:    Transition of Care [facility]) Screening Note   Patient Details  Name: LOWERY PAULLIN Date of Birth: 02/23/1929   Transition of Care (TOC) CM/SW Contact:    Salome Arnt, [place] Phone Number: 06/19/2021, 10:35 AM    Transition of Care Department [facility]) has reviewed patient and no TOC needs have been identified at this time. We will continue to monitor patient advancement through interdisciplinary progression rounds. If new patient transition needs arise, please place a TOC consult.        Barriers to Discharge: Barriers Resolved  Expected Discharge Plan and Services           Expected Discharge Date: 06/19/21                                     Social Determinants of Health (SDOH) Interventions    Readmission Risk Interventions No flowsheet data found.

## 2021-06-19 NOTE — Discharge Summary (Signed)
Ryan Hansen, is a 88 y.o. male  DOB 08-Feb-1929  MRN [id number].  Admission date:  06/17/2021  Admitting Physician  Roxan Hockey, MD  Discharge Date:  06/19/2021   Primary MD  Lemmie Evens, MD  Recommendations for primary care physician for things to follow:   1)Avoid ibuprofen/Advil/Aleve/Motrin/Goody Powders/Naproxen/BC powders/Meloxicam/Diclofenac/Indomethacin and other Nonsteroidal anti-inflammatory medications as these will make you more likely to bleed and can cause stomach ulcers, can also cause Kidney problems.   2)Follow-up Gastroenterologist Dr. Laural Golden- address [address], [place] [address],,XBWIO Number [phone] -----to schedule an outpatient esophagram test  3)Very soft diet advised--avoid hard food or meats--- please drink enough liquids with your meals  Admission Diagnosis  Malaise [R53.81] Dysphagia [R13.10] Dysphagia, unspecified type [R13.10] Nausea and vomiting, unspecified vomiting type [R11.2]   Discharge Diagnosis  Malaise [R53.81] Dysphagia [R13.10] Dysphagia, unspecified type [R13.10] Nausea and vomiting, unspecified vomiting type [R11.2]    Principal Problem:   Dysphagia Active Problems:   CAD (coronary artery disease)   Abnormal CT scan, colon      Past Medical History:  Diagnosis Date   Aortic stenosis    Asthma    BPH (benign prostatic hyperplasia)    CAD (coronary artery disease)    catheterization with 60% DX1, less than 50% circumflex, 40% LRAS   Elevated cholesterol    GERD (gastroesophageal reflux disease)    Hypertension    Murmur 12/24/12    Past Surgical History:  Procedure Laterality Date   CARDIAC CATHETERIZATION Left    No previous surgery      HPI  from the history and physical done on the day of admission:      Ryan Hansen  is a 88 y.o. male history of CAD, aortic stenosis, asthma, HTN, HLD, GERD and obvious  cognitive and memory deficits presents to the ED with concerns about difficulty with swallowing for the last 4 months worse over the last couple days - Patient is a poor historian unclear if he had any significant weight loss -Additional history obtained from patient's son Olivia Mackie at bedside - In the ED-CT angio head and neck with fluid distended esophagus where seen in the upper chest, branching low-density in the ventral right brain, likely dilated perivascular space although associated with calcification which is uncommon, presumed meningioma along floor of the right anterior cranial fossa, incidental. MRI brain with no acute intracranial pathology, single abnormality in the right basal ganglia corresponding to hypodensity on CT favoring a cavernoma with no superimposed hemorrhage or edema/mass effect. -CBC and chemistry without significant abnormality -UDS negative No fever  Or chills    No Diarrhea -Patient is forgetful and repeating himself -Patient reports excessive salivation and postnasal drainage     Hospital Course:   Brief Narrative:    88 y.o. male history of CAD, aortic stenosis, asthma, HTN, HLD, GERD and obvious cognitive and memory deficits admitted on 06/17/2021 with Dysphagia and Sialorrhea --due to food impaction/esophageal stenosis/ stricture--     -Assessment and Plan:  1)Dysphagia and Sialorrhea --due to food impaction/esophageal stenosis/ stricture-- -Failed trial of glucagon EGD on 06/18/21 shows Air-fluid level esophagus suggesting distal obstruction secondary to food debris which look like scrambled egg. Esophageal dysmotility. Foreign body removed distally without any difficulty. Soft stricture at GE junction dilated to 15 mm. 2 cm sliding hiatal hernia. Small gastric polyp at body.  Was left alone. Antral gastritis with pyloric channel inflammation. Bulbar and post bulbar duodenitis with multiple erosions.  tolerated Isosorbide 5 mg 3 times daily with  meals well okay to discharge home on same -Continue Protonix -Tolerating full liquid diet well -Patient will need an esophagram and repeat EGD as outpatient Dr. Olevia Perches office will arrange   2)CAD/HTN--stable, continue simvastatin, -Continue -Continue Toprol-XL    3)BPH--continue Proscar   4)Asthma--- stable, without exacerbation, continue bronchodilators   5)Dementia--patient with obvious cognitive and memory deficits -Patient's son reports that this is not new but has been getting worse lately -Patient lives alone and continues to drive -Advised further evaluation by neurology or PCP --Advised patient son Olivia Mackie that patient should no longer be driving   Disposition--Home in stable condition   Disposition: The patient is from: Home              Anticipated d/c is to: Home  Discharge Condition: stable  Follow UP   Follow-up Information     Rehman, Mechele Dawley, MD. Schedule an appointment as soon as possible for a visit.   Specialty: Gastroenterology Contact information: [address], [address] [place] [address] [phone]                  Consults obtained - Gi  Diet and Activity recommendation:  As advised  Discharge Instructions    Discharge Instructions     Call MD for:  difficulty breathing, headache or visual disturbances   Complete by: As directed    Call MD for:  persistant dizziness or light-headedness   Complete by: As directed    Call MD for:  persistant nausea and vomiting   Complete by: As directed    Call MD for:  severe uncontrolled pain   Complete by: As directed    Call MD for:  temperature >100.4   Complete by: As directed    Diet - low sodium heart healthy   Complete by: As directed    Discharge instructions   Complete by: As directed    1)Avoid ibuprofen/Advil/Aleve/Motrin/Goody Powders/Naproxen/BC powders/Meloxicam/Diclofenac/Indomethacin and other Nonsteroidal anti-inflammatory medications as these will make you more likely  to bleed and can cause stomach ulcers, can also cause Kidney problems.   2)Follow-up Gastroenterologist Dr. Laural Golden- address [address], [place] [address],,IRSWN Number [phone] -----to schedule an outpatient esophagram test  3)Very soft diet advised--avoid hard food or meats--- please drink enough liquids with your meals   Increase activity slowly   Complete by: As directed    No wound care   Complete by: As directed          Discharge Medications     Allergies as of 06/19/2021       Reactions   Procaine Hcl    REACTION: ANXIOUSNESS AND NERVOUSNESS        Medication List     STOP taking these medications    Glycopyrrolate 1 MG/5ML Soln   KRILL OIL PO   metoprolol tartrate 25 MG tablet Commonly known as: LOPRESSOR       TAKE these medications    AeroChamber Plus inhaler Use  as instructed   budesonide-formoterol 160-4.5 MCG/ACT inhaler Commonly known as: SYMBICORT Use 2 puffs twice daily to prevent cough or wheeze. Rinse, gargle and spit after use.   cetirizine 5 MG tablet Commonly known as: ZYRTEC Take 1 tablet (5 mg total) by mouth daily.   finasteride 5 MG tablet Commonly known as: PROSCAR Take 5 mg by mouth daily.   fluticasone 50 MCG/ACT nasal spray Commonly known as: FLONASE Place 2 sprays into both nostrils daily.   isosorbide mononitrate 10 MG tablet Commonly known as: ISMO Take 0.5 tablets (5 mg total) by mouth 3 (three) times daily before meals.   LORazepam 0.5 MG tablet Commonly known as: Ativan Take 1 tablet (0.5 mg total) by mouth every 12 (twelve) hours as needed for anxiety.   metoprolol succinate 25 MG 24 hr tablet Commonly known as: TOPROL-XL Take 25 mg by mouth every morning.   mirtazapine 15 MG tablet Commonly known as: REMERON Take 0.5 tablets (7.5 mg total) by mouth at bedtime. What changed:  how much to take when to take this reasons to take this   ondansetron 4 MG disintegrating  tablet Commonly known as: ZOFRAN-ODT Take 1 tablet (4 mg total) by mouth every 8 (eight) hours as needed for nausea or vomiting.   pantoprazole 40 MG tablet Commonly known as: PROTONIX Take 1 tablet (40 mg total) by mouth daily before breakfast. Start taking on: June 20, 2021   ProAir HFA 108 (90 Base) MCG/ACT inhaler Generic drug: albuterol Inhale 2 puffs into the lungs every 6 (six) hours as needed for wheezing or shortness of breath. What changed: how much to take   simvastatin 40 MG tablet Commonly known as: ZOCOR TAKE ONE (1) TABLET EACH DAY What changed: See the new instructions.        Major procedures and Radiology Reports - PLEASE review detailed and final reports for all details, in brief -   CT ANGIO HEAD NECK W WO CM  Result Date: 06/17/2021 CLINICAL DATA:  Neuro deficit with acute stroke suspected. Nausea and vomiting at home EXAM: CT ANGIOGRAPHY HEAD AND NECK TECHNIQUE: Multidetector CT imaging of the head and neck was performed using the standard protocol during bolus administration of intravenous contrast. Multiplanar CT image reconstructions and MIPs were obtained to evaluate the vascular anatomy. Carotid stenosis measurements (when applicable) are obtained utilizing NASCET criteria, using the distal internal carotid diameter as the denominator. RADIATION DOSE REDUCTION: This exam was performed according to the departmental dose-optimization program which includes automated exposure control, adjustment of the mA and/or kV according to patient size and/or use of iterative reconstruction technique. CONTRAST:  32mL OMNIPAQUE IOHEXOL 350 MG/ML SOLN COMPARISON:  None. FINDINGS: CT HEAD FINDINGS Brain: No evidence of acute infarction, hemorrhage, hydrocephalus, extra-axial collection or mass lesion/mass effect. Branching low-density along the lower right brain with adjacent mineralization, favor dilated perivascular space with calcification, although the calcification is  atypical. Generalized cerebral volume loss. On postcontrast imaging there is faint dural based nodular enhancement along the inferior right frontal lobe measuring 8 mm. Vascular:  See below Skull: Normal. Negative for fracture or focal lesion. Sinuses: Imaged portions are clear. Orbits: No acute finding. Review of the MIP images confirms the above findings CTA NECK FINDINGS Aortic arch: Atheromatous plaque with 3 vessel branching. No acute finding. Right carotid system: Mild plaque at the bifurcation without stenosis or ulceration. Additional calcified plaque at the distal segment of the ICA. Left carotid system: Atheromatous plaque at the bifurcation without significant stenosis or ulceration.  Vertebral arteries: No proximal subclavian stenosis. Calcified plaque at the left vertebral origin with only mild narrowing. Both vertebral arteries are widely patent to the dura. Skeleton: Generalized cervical spine degeneration with C6-7 and C7-T1 anterolisthesis. Other neck: No acute finding Upper chest: Fluid distended esophagus possibly related to history of vomiting. No similar appearance on recent abdominal CT 05/04/2021 Review of the MIP images confirms the above findings CTA HEAD FINDINGS Anterior circulation: Atheromatous calcification of the carotid siphons. No flow limiting stenosis, branch occlusion, beading, or aneurysm. Posterior circulation: Mild calcified plaque on the left V4 segment. The vertebral and basilar arteries are smoothly contoured and widely patent. Negative for aneurysm or vascular malformation Venous sinuses: Negative Anatomic variants: None significant Review of the MIP images confirms the above findings IMPRESSION: 1. No emergent finding vascular finding. Mild for age atherosclerosis. 2. Fluid distended esophagus where seen in the upper chest. 3. Branching low-density in the ventral right brain, likely dilated perivascular space although associated with calcification which is uncommon.  Attention on any planned brain MRI. 4. Presumed meningioma along the floor of the right anterior cranial fossa measuring 8 mm, incidental. Electronically Signed   By: Jorje Guild M.D.   On: 06/17/2021 05:43   MR BRAIN WO CONTRAST  Result Date: 06/17/2021 CLINICAL DATA:  Nausea and vomiting today, stroke suspected EXAM: MRI HEAD WITHOUT CONTRAST TECHNIQUE: Multiplanar, multiecho pulse sequences of the brain and surrounding structures were obtained without intravenous contrast. COMPARISON:  CT/CTA head and neck obtained earlier the same day FINDINGS: Brain: There is a cystic area in the right basal ganglia corresponding to the hypodensity seen on the prior CT measuring approximally 1.4 cm x 1.0 cm. There is a rim of T2 hypointensity with significant blooming artifact on the SWI sequence. There is curvilinear diffusion restriction along the posterior margin. A separate cystic area in the anterior limb of the internal capsule is favored to reflect a dilated perivascular space. There is no evidence of acute intracranial hemorrhage or extra-axial fluid collection. There is no evidence of acute territorial infarct. There is a background of moderate global parenchymal volume loss with prominence of the ventricular system and extra-axial CSF spaces. Patchy FLAIR signal abnormality in the subcortical and periventricular white matter likely reflects sequela of mild chronic white matter microangiopathy. There is no solid mass lesion.  There is no midline shift. Vascular: Normal flow voids. Skull and upper cervical spine: Normal marrow signal. Sinuses/Orbits: The paranasal sinuses are clear. The globes and orbits are unremarkable. Other: None. IMPRESSION: 1. No evidence of acute intracranial pathology. 2. 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 There is no evidence of superimposed acute hemorrhage, and no surrounding edema or  mass effect. Electronically Signed   By: Valetta Mole M.D.   On: 06/17/2021 08:17    Micro Results  Recent Results (from the past 240 hour(s))  Novel Coronavirus, NAA ([facility])     Status: None   Collection Time: 06/16/21  3:07 PM   Specimen: Nasopharyngeal Swab; Nasopharyngeal(NP) swabs in vial transport medium   Nasopharynge  Result Value Ref Range Status   SARS-CoV-2, NAA Not Detected Not Detected Final    Comment: This nucleic acid amplification test was developed and its performance characteristics determined by [employer]. Nucleic acid amplification tests include RT-PCR and TMA. This test has not been FDA cleared or approved. This test has been authorized by FDA under an Emergency Use Authorization (  EUA). This test is only authorized for the duration of time the declaration that circumstances exist justifying the authorization of the emergency use of in vitro diagnostic tests for detection of SARS-CoV-2 virus and/or diagnosis of COVID-19 infection under section 564(b)(1) of the Act, 21 U.S.C. 938BOF-7(P) (1), unless the authorization is terminated or revoked sooner. When diagnostic testing is negative, the possibility of a false negative result should be considered in the context of a patient's recent exposures and the presence of clinical signs and symptoms consistent with COVID-19. An individual without symptoms of COVID-19 and who is not shedding SARS-CoV-2 virus wo uld expect to have a negative (not detected) result in this assay.   Resp Panel by RT-PCR (Flu A&B, Covid) Nasopharyngeal Swab     Status: None   Collection Time: 06/17/21  3:05 AM   Specimen: Nasopharyngeal Swab; Nasopharyngeal(NP) swabs in vial transport medium  Result Value Ref Range Status   SARS Coronavirus 2 by RT PCR NEGATIVE NEGATIVE Final    Comment: (NOTE) SARS-CoV-2 target nucleic acids are NOT DETECTED.  The SARS-CoV-2 RNA is generally detectable in upper respiratory specimens during the  acute phase of infection. The lowest concentration of SARS-CoV-2 viral copies this assay can detect is 138 copies/mL. A negative result does not preclude SARS-Cov-2 infection and should not be used as the sole basis for treatment or other patient management decisions. A negative result may occur with  improper specimen collection/handling, submission of specimen other than nasopharyngeal swab, presence of viral mutation(s) within the areas targeted by this assay, and inadequate number of viral copies(<138 copies/mL). A negative result must be combined with clinical observations, patient history, and epidemiological information. The expected result is Negative.  Fact Sheet for Patients:  EntrepreneurPulse.com.au  Fact Sheet for Healthcare Providers:  IncredibleEmployment.be  This test is no t yet approved or cleared by the [place] FDA and  has been authorized for detection and/or diagnosis of SARS-CoV-2 by FDA under an Emergency Use Authorization (EUA). This EUA will remain  in effect (meaning this test can be used) for the duration of the COVID-19 declaration under Section 564(b)(1) of the Act, 21 U.S.C.section 360bbb-3(b)(1), unless the authorization is terminated  or revoked sooner.       Influenza A by PCR NEGATIVE NEGATIVE Final   Influenza B by PCR NEGATIVE NEGATIVE Final    Comment: (NOTE) The Xpert Xpress SARS-CoV-2/FLU/RSV plus assay is intended as an aid in the diagnosis of influenza from Nasopharyngeal swab specimens and should not be used as a sole basis for treatment. Nasal washings and aspirates are unacceptable for Xpert Xpress SARS-CoV-2/FLU/RSV testing.  Fact Sheet for Patients: EntrepreneurPulse.com.au  Fact Sheet for Healthcare Providers: IncredibleEmployment.be  This test is not yet approved or cleared by the [place] FDA and has been authorized for detection and/or  diagnosis of SARS-CoV-2 by FDA under an Emergency Use Authorization (EUA). This EUA will remain in effect (meaning this test can be used) for the duration of the COVID-19 declaration under Section 564(b)(1) of the Act, 21 U.S.C. section 360bbb-3(b)(1), unless the authorization is terminated or revoked.  Performed at [facility], [address] [place], [place] [address]     Today   Subjective    Minoru Chap today has no new complaints ' No fever  Or chills   No Nausea, Vomiting or Diarrhea         Patient has been seen and examined prior to discharge   Objective   Blood pressure 140/62, pulse 62, temperature  97.6 F (36.4 C), resp. rate 16, height 5\' 9"  (1.753 m), weight 68 kg, SpO2 98 %.   Intake/Output Summary (Last 24 hours) at 06/19/2021 1103 Last data filed at 06/18/2021 1211 Gross per 24 hour  Intake 500 ml  Output --  Net 500 ml    Exam Gen:- Awake Alert,  in no apparent distress  HEENT:- [place].AT, No sclera icterus Neck-Supple Neck,No JVD,.  Lungs-  CTAB , fair symmetrical air movement CV- S1, S2 normal, regular  Abd-  +ve B.Sounds, Abd Soft, No tenderness,    Extremity/Skin:- No  edema, pedal pulses present  Psych-baseline/underlying memory and cognitive deficits consistent with dementia  -neuro-no new focal deficits, no tremors   Data Review   CBC w Diff:  Lab Results  Component Value Date   WBC 8.8 06/19/2021   HGB 11.5 (L) 06/19/2021   HCT 36.0 (L) 06/19/2021   PLT 180 06/19/2021   LYMPHOPCT 14 06/17/2021   MONOPCT 9 06/17/2021   EOSPCT 2 06/17/2021   BASOPCT 0 06/17/2021    CMP:  Lab Results  Component Value Date   NA 135 06/19/2021   K 3.7 06/19/2021   CL 105 06/19/2021   CO2 25 06/19/2021   BUN 15 06/19/2021   CREATININE 0.93 06/19/2021   CREATININE 0.94 12/04/2012   PROT 8.7 (H) 06/17/2021   ALBUMIN 4.1 06/17/2021   BILITOT 0.9 06/17/2021   ALKPHOS 60 06/17/2021   AST 15 06/17/2021   ALT 13 06/17/2021  .  Total Discharge  time is about 33 minutes  Roxan Hockey M.D on 06/19/2021 at 11:03 AM  Go to [email or web address] -  for contact info  [facility] - Office  [phone]

## 2021-06-19 NOTE — Progress Notes (Signed)
1540: Patient became very agitated and confused after a phone call with his son. Patient was stating that he needed to go home and cook dinner. Stating that he would come right back he just needed to get some fresh air. RN administered PRN Ativan at this time. [employer] fast streamer and RN were able to redirect patient using some diversional activities such as ice cream, newspaper and talking.  1720: Patient again became agitated and could no longer be redirected. Patient retrieved his coat and stated he was leaving. When the Tech/safety sitter tried to explain why he could not leave he began to shove her. Dr. Joesph Fillers was notified and security was called. MD placed orders and one time dose of Ativan was administered. Patient was then able to calm down some and be redirected to sit down in his chair. Patient was then resting comfortably and safety sitter remained at bedside.

## 2021-06-19 NOTE — Progress Notes (Signed)
Subjective:  Patient has no complaints.  He denies headache or lightheadedness when he walks to the bathroom.  He has no difficulty swallowing.  According to sitter he did fine with full liquids.  He denies abdominal pain.  Current Medications:  Current Facility-Administered Medications:    0.9 %  sodium chloride infusion, 250 mL, Intravenous, PRN, Emokpae, Courage, MD   acetaminophen (TYLENOL) tablet 650 mg, 650 mg, Oral, Q6H PRN **OR** acetaminophen (TYLENOL) suppository 650 mg, 650 mg, Rectal, Q6H PRN, Emokpae, Courage, MD   bisacodyl (DULCOLAX) suppository 10 mg, 10 mg, Rectal, Daily PRN, Emokpae, Courage, MD   dextrose 5 %-0.45 % sodium chloride infusion, , Intravenous, Continuous, Emokpae, Courage, MD, Last Rate: 40 mL/hr at 06/18/21 2157, New Bag at 06/18/21 2157   finasteride (PROSCAR) tablet 5 mg, 5 mg, Oral, Daily, Emokpae, Courage, MD, 5 mg at 06/19/21 0843   isosorbide mononitrate (ISMO) tablet 5 mg, 5 mg, Oral, TID AC, Simcha Farrington U, MD, 5 mg at 06/19/21 0843   labetalol (NORMODYNE) injection 10 mg, 10 mg, Intravenous, Q4H PRN, Denton Brick, Courage, MD, 10 mg at 06/18/21 0833   LORazepam (ATIVAN) injection 0.5 mg, 0.5 mg, Intravenous, Q12H PRN, Denton Brick, Courage, MD, 0.5 mg at 06/18/21 1540   metoprolol succinate (TOPROL-XL) 24 hr tablet 25 mg, 25 mg, Oral, q morning, Emokpae, Courage, MD, 25 mg at 06/19/21 0849   mometasone-formoterol (DULERA) 200-5 MCG/ACT inhaler 2 puff, 2 puff, Inhalation, BID, Emokpae, Courage, MD, 2 puff at 06/19/21 0858   ondansetron (ZOFRAN) tablet 4 mg, 4 mg, Oral, Q6H PRN **OR** ondansetron (ZOFRAN) injection 4 mg, 4 mg, Intravenous, Q6H PRN, Denton Brick, Courage, MD, 4 mg at 06/17/21 2044   pantoprazole (PROTONIX) injection 40 mg, 40 mg, Intravenous, Q12H, Javonte Elenes U, MD, 40 mg at 06/19/21 0844   polyethylene glycol (MIRALAX / GLYCOLAX) packet 17 g, 17 g, Oral, Daily PRN, Denton Brick, Courage, MD   simvastatin (ZOCOR) tablet 40 mg, 40 mg, Oral, q1800,  Emokpae, Courage, MD, 40 mg at 06/18/21 1734   sodium chloride flush (NS) 0.9 % injection 3 mL, 3 mL, Intravenous, Q12H, Emokpae, Courage, MD, 3 mL at 06/18/21 2150   sodium chloride flush (NS) 0.9 % injection 3 mL, 3 mL, Intravenous, Q12H, Emokpae, Courage, MD, 3 mL at 06/17/21 2149   sodium chloride flush (NS) 0.9 % injection 3 mL, 3 mL, Intravenous, PRN, Emokpae, Courage, MD   traZODone (DESYREL) tablet 100 mg, 100 mg, Oral, QHS, Emokpae, Courage, MD, 100 mg at 06/18/21 2149   Objective: Blood pressure 140/62, pulse 62, temperature 97.6 F (36.4 C), resp. rate 16, height _0  (1.753 m), weight 68 kg, SpO2 98 %. Patient is alert and in no acute distress. Abdominal examinations within normal limits.  Labs/studies Results:   CBC Latest Ref Rng & Units 06/19/2021 06/18/2021 06/17/2021  WBC 4.0 - 10.5 K/uL 8.8 9.0 -  Hemoglobin 13.0 - 17.0 g/dL 11.5(L) 12.3(L) 15.0  Hematocrit 39.0 - 52.0 % 36.0(L) 37.6(L) 44.0  Platelets 150 - 400 K/uL 180 233 -    CMP Latest Ref Rng & Units 06/19/2021 06/18/2021 06/17/2021  Glucose 70 - 99 mg/dL 101(H) 119(H) 92  BUN 8 - 23 mg/dL 15 26(H) 26(H)  Creatinine 0.61 - 1.24 mg/dL 0.93 1.01 1.00  Sodium 135 - 145 mmol/L 135 139 142  Potassium 3.5 - 5.1 mmol/L 3.7 3.8 4.0  Chloride 98 - 111 mmol/L 105 109 107  CO2 22 - 32 mmol/L 25 24 -  Calcium 8.9 - 10.3 mg/dL 8.0(L) 8.3(L) -  Total Protein 6.5 - 8.1 g/dL - - -  Total Bilirubin 0.3 - 1.2 mg/dL - - -  Alkaline Phos 38 - 126 U/L - - -  AST 15 - 41 U/L - - -  ALT 0 - 44 U/L - - -    Hepatic Function Latest Ref Rng & Units 06/17/2021 08/20/2019 02/19/2014  Total Protein 6.5 - 8.1 g/dL 8.7(H) 7.5 7.1  Albumin 3.5 - 5.0 g/dL 4.1 4.1 4.1  AST 15 - 41 U/L _0 ALT 0 - 44 U/L _1 Alk Phosphatase 38 - 126 U/L 60 69 54  Total Bilirubin 0.3 - 1.2 mg/dL 0.9 0.5 0.8  Bilirubin, Direct 0.0 - 0.3 mg/dL - - 0.2      Assessment:  #1.  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  Patient underwent EGD with foreign body removal and balloon dilation of GE junction to 15 mm yesterday.  He has been able to swallow full liquids.  He was begun on low-dose Ismo which she is tolerating well. Patient stable for discharge.  #2.  Mild anemia.  Recommendations  Continue pantoprazole at a dose of 40 mg by mouth 30 minutes before breakfast and Ismo at current dose. We will schedule patient for esophagogram in 2 to 3 days.  My office will contact patient's son Olivia Mackie. Should continue full liquids until the time of esophagogram. I called Olivia Mackie and brought him up-to-date about his dad's condition and also discussed with Dr. Roxan Hockey.

## 2021-06-19 NOTE — Discharge Instructions (Signed)
1)Avoid ibuprofen/Advil/Aleve/Motrin/Goody Powders/Naproxen/BC powders/Meloxicam/Diclofenac/Indomethacin and other Nonsteroidal anti-inflammatory medications as these will make you more likely to bleed and can cause stomach ulcers, can also cause Kidney problems.   2)Follow-up Gastroenterologist Dr. Laural Golden- address [address], [place] [address],,[facility] Number [phone] -----to schedule an outpatient esophagram test  3)Very soft diet advised--avoid hard food or meats--- please drink enough liquids with your meals

## 2021-06-20 ENCOUNTER — Other Ambulatory Visit (INDEPENDENT_AMBULATORY_CARE_PROVIDER_SITE_OTHER): Payer: Self-pay

## 2021-06-20 DIAGNOSIS — R1319 Other dysphagia: Secondary | ICD-10-CM

## 2021-06-20 LAB — H. PYLORI ANTIBODY, IGG: H Pylori IgG: 0.51 Index Value (ref 0.00–0.79)

## 2021-06-21 ENCOUNTER — Ambulatory Visit (HOSPITAL_COMMUNITY)
Admission: RE | Admit: 2021-06-21 | Discharge: 2021-06-21 | Disposition: A | Discharge status: Home / Self Care | Payer: Medicare PPO | Source: Ambulatory Visit | Attending: Internal Medicine | Admitting: Internal Medicine

## 2021-06-21 ENCOUNTER — Other Ambulatory Visit: Payer: Self-pay

## 2021-06-21 DIAGNOSIS — R1319 Other dysphagia: Secondary | ICD-10-CM | POA: Diagnosis not present

## 2021-06-22 ENCOUNTER — Telehealth (INDEPENDENT_AMBULATORY_CARE_PROVIDER_SITE_OTHER): Payer: Self-pay

## 2021-06-22 NOTE — Telephone Encounter (Signed)
Patient son called today wanting to know what the recent Egd showed. I advised I would let Dr.Rehman know and reach out in regards to this . He states understanding.  ?

## 2021-06-22 NOTE — Telephone Encounter (Signed)
Dr. Laural Golden told me he would call pt and for me to let pt's son know. I called pt's son tracy and let him know dr Laural Golden would call with results.  ?

## 2021-06-23 ENCOUNTER — Encounter (HOSPITAL_COMMUNITY): Payer: Self-pay | Admitting: Internal Medicine

## 2021-06-23 ENCOUNTER — Other Ambulatory Visit (INDEPENDENT_AMBULATORY_CARE_PROVIDER_SITE_OTHER): Payer: Self-pay

## 2021-06-23 ENCOUNTER — Other Ambulatory Visit (HOSPITAL_COMMUNITY): Payer: Self-pay | Admitting: Specialist

## 2021-06-23 DIAGNOSIS — R1319 Other dysphagia: Secondary | ICD-10-CM

## 2021-06-23 DIAGNOSIS — K224 Dyskinesia of esophagus: Secondary | ICD-10-CM

## 2021-07-11 ENCOUNTER — Other Ambulatory Visit (HOSPITAL_COMMUNITY): Payer: Medicare PPO

## 2021-07-11 ENCOUNTER — Ambulatory Visit (HOSPITAL_COMMUNITY): Payer: Medicare PPO | Admitting: Speech Pathology

## 2021-07-28 ENCOUNTER — Other Ambulatory Visit (HOSPITAL_COMMUNITY): Payer: Self-pay | Admitting: Specialist

## 2021-07-28 DIAGNOSIS — K224 Dyskinesia of esophagus: Secondary | ICD-10-CM

## 2021-07-28 DIAGNOSIS — R1312 Dysphagia, oropharyngeal phase: Secondary | ICD-10-CM

## 2021-08-16 ENCOUNTER — Encounter (HOSPITAL_COMMUNITY): Payer: Medicare HMO | Admitting: Speech Pathology

## 2021-08-25 ENCOUNTER — Ambulatory Visit (HOSPITAL_COMMUNITY): Payer: Medicare HMO | Admitting: Speech Pathology

## 2021-08-29 ENCOUNTER — Ambulatory Visit (HOSPITAL_COMMUNITY): Payer: Medicare HMO | Attending: Internal Medicine | Admitting: Speech Pathology

## 2021-08-29 ENCOUNTER — Encounter (HOSPITAL_COMMUNITY): Payer: Self-pay | Admitting: Speech Pathology

## 2021-08-29 ENCOUNTER — Ambulatory Visit (HOSPITAL_COMMUNITY)
Admission: RE | Admit: 2021-08-29 | Discharge: 2021-08-29 | Disposition: A | Discharge status: Home / Self Care | Payer: Medicare HMO | Source: Ambulatory Visit | Attending: Internal Medicine | Admitting: Internal Medicine

## 2021-08-29 DIAGNOSIS — K224 Dyskinesia of esophagus: Secondary | ICD-10-CM

## 2021-08-29 DIAGNOSIS — R1319 Other dysphagia: Secondary | ICD-10-CM

## 2021-08-29 DIAGNOSIS — R1312 Dysphagia, oropharyngeal phase: Secondary | ICD-10-CM | POA: Diagnosis present

## 2021-08-29 NOTE — Therapy (Signed)
[facility] ?[address] ?[place], [place], [address] ?Phone: [phone]   Fax:  [phone] ? ?Modified Barium Swallow ? ?Patient Details  ?Name: Ryan Hansen ?MRN: [id number] ?Date of Birth: Oct 15, 1928 ?No data recorded ? ?Encounter Date: 08/29/2021 ? ? End of Session - 08/29/21 1518   ? ? Visit Number 1   ? Number of Visits 1   ? Authorization Type [employer] H[employer]   ? SLP Start Time 1205   ? SLP Stop Time  1233   ? SLP Time Calculation (min) 28 min   ? Activity Tolerance Patient tolerated treatment well   ? ?  ?  ? ?  ? ? ?Past Medical History:  ?Diagnosis Date  ? Aortic stenosis   ? Asthma   ? BPH (benign prostatic hyperplasia)   ? CAD (coronary artery disease)   ? catheterization with 60% DX1, less than 50% circumflex, 40% LRAS  ? Elevated cholesterol   ? GERD (gastroesophageal reflux disease)   ? Hypertension   ? Murmur 12/24/12  ? ? ?Past Surgical History:  ?Procedure Laterality Date  ? CARDIAC CATHETERIZATION Left   ? ESOPHAGEAL DILATION  06/18/2021  ? Procedure: ESOPHAGEAL DILATION;  Surgeon: Rogene Houston, MD;  Location: [facility];  Service: Endoscopy;;  ? ESOPHAGOGASTRODUODENOSCOPY (EGD) WITH PROPOFOL N/A 06/18/2021  ? Procedure: ESOPHAGOGASTRODUODENOSCOPY (EGD) WITH PROPOFOL;  Surgeon: Rogene Houston, MD;  Location: [facility];  Service: Endoscopy;  Laterality: N/A;  ? No previous surgery    ? ? ?There were no vitals filed for this visit. ? ? Subjective Assessment - 08/29/21 1506   ? ? Subjective "I don't have any trouble swallowing."   ? Special Tests MBSS   ? Currently in Pain? No/denies   ? ?  ?  ? ?  ? ? ? ? ? ? General - 08/29/21 1507   ? ?  ? General Information  ? Date of Onset 06/23/21   ? HPI Ryan Hansen  is a 88 y.o. male history of CAD, aortic stenosis, asthma, HTN, HLD, and GERD who was referred by Dr. Hildred Laser for MBSS due to silent aspiration noted on barium swallow in February 2023 (silent aspiration, vallecular and pyriform sinus  residue, esophageal dysmotility). He underwent EGD during hospitalization (Esophageal dysphagia secondary to motility disorder and soft stricture at GE junction.  Patient underwent EGD with foreign body removal and balloon dilation of GE junction to 15 mm).   ? Type of Study MBS-Modified Barium Swallow Study   ? Diet Prior to this Study Regular;Thin liquids   ? Temperature Spikes Noted No   ? Respiratory Status Room air   ? History of Recent Intubation No   ? Behavior/Cognition Alert;Cooperative;Pleasant mood   ? Oral Cavity Assessment Within Functional Limits   ? Oral Care Completed by SLP No   ? Oral Cavity - Dentition Dentures, top;Dentures, bottom   ? Vision Functional for self feeding   ? Self-Feeding Abilities Able to feed self   ? Patient Positioning Upright in chair   ? Baseline Vocal Quality Normal;Breathy   ? Volitional Cough Strong   ? Volitional Swallow Able to elicit   ? Anatomy Within functional limits   ? Pharyngeal Secretions Not observed secondary MBS   ? ?  ?  ? ?  ? ? ? ? ? Oral Preparation/Oral Phase - 08/29/21 1511   ? ?  ? Oral Preparation/Oral Phase  ? Oral Phase Impaired   ?  ?  Oral - Thin  ? Oral - Thin Teaspoon Within functional limits   ? Oral - Thin Cup Oral residue   min amounts in U/L dentures which then pool to valleculae  ? Oral - Thin Straw Oral residue   ?  ? Oral - Solids  ? Oral - Puree Piecemeal swallowing   ? Oral - Regular Piecemeal swallowing   ? Oral - Pill Delayed A-P transit   ?  ? Electrical stimulation - Oral Phase  ? Was Electrical Stimulation Used No   ? ?  ?  ? ?  ? ? ? Pharyngeal Phase - 08/29/21 1513   ? ?  ? Pharyngeal Phase  ? Pharyngeal Phase Impaired   ?  ? Pharyngeal - Thin  ? Pharyngeal- Thin Teaspoon Swallow initiation at vallecula;Reduced epiglottic inversion;Pharyngeal residue - valleculae   ? Pharyngeal- Thin Cup Swallow initiation at vallecula;Pharyngeal residue - valleculae;Pharyngeal residue - pyriform;Reduced epiglottic inversion   ? Pharyngeal- Thin  Straw Swallow initiation at vallecula;Reduced epiglottic inversion;Penetration/Aspiration during swallow;Pharyngeal residue - valleculae;Pharyngeal residue - pyriform;Reduced airway/laryngeal closure   ? Pharyngeal Material enters airway, remains ABOVE vocal cords and not ejected out   ?  ? Pharyngeal - Solids  ? Pharyngeal- Puree Swallow initiation at vallecula;Reduced epiglottic inversion;Reduced tongue base retraction;Pharyngeal residue - valleculae;Pharyngeal residue - pyriform   ? Pharyngeal- Regular Reduced tongue base retraction;Pharyngeal residue - valleculae   ? Pharyngeal- Pill Penetration/Aspiration during swallow   ? Pharyngeal Material enters airway, passes BELOW cords without attempt by patient to eject out (silent aspiration)   Pt aspirated min amount of thins when taking sequential sips to swallow barium tablet  ?  ? Electrical Stimulation - Pharyngeal Phase  ? Was Electrical Stimulation Used No   ? ?  ?  ? ?  ? ? ? Cricopharyngeal Phase - 08/29/21 1516   ? ?  ? Cervical Esophageal Phase  ? Cervical Esophageal Phase Impaired   ?  ? Cervical Esophageal Phase - Thin  ? Thin Cup Prominent cricopharyngeal segment   ?  ? Cervical Esophageal Phase - Comment  ? Other Esophageal Phase Observations barium tablet delayed near the GEJ, collection of barium above with some backflow noted in esophagus   ? ?  ?  ? ?  ? ? ? ? ? ? ? ? ? ? ? Plan - 08/29/21 1519   ? ? Clinical Impression Statement MBSS completed and reveals Pt with min oropharyngeal dysphagia characterized by mildly prolonged oral prep, Pt with U/L dentures and has some liquid residuals around dentures which then pool to the valleculae, piecemeal deglutition with purees and solids; pharyngeal phase characterized by swallow trigger at the level of the valleculae, min decreased tongue base retraction and epiglottic deflection resulting in min vallecular and pyriform residue with cup sips thin, puree, and regular textures which clears with repeat/dry  swallows. Pt with one episode of silent aspiration of thin liquids when taking sequential cup sips to swallow the barium tablet. Pt with penetration to the laryngeal vestibule with trace amount of thins via straw. Pt with prominent cricopharyngeus. Esophageal sweep revealed delayed empyting of barium in the esophagus with stasis of the barium tablet near the GE junction. Pt with known esophageal dysphagia and min pharyngeal phase with mild weakness, however Pt denies difficulty swallowing and no recent bouts of PNA. He does indicate that he avoids meats at times due to difficulty masticating. Recommend self regulated regular textures and thin liquids with standard aspiration and reflux precautions and pills  whole in puree and follow with liquid wash, cut/chop meats, Pt to swallow 2x for each bite/sip. No further SLP services indicated at this time. This study was reviewed with Pt and written strategies were provided.   ? Treatment/Interventions Aspiration precaution training;Patient/family education   ? Potential to Achieve Goals Good   ? Consulted and Agree with Plan of Care Patient   ? ?  ?  ? ?  ? ? ?Patient will benefit from skilled therapeutic intervention in order to improve the following deficits and impairments:   ?Dysphagia, oropharyngeal phase - Plan: SLP plan of care cert/re-cert ? ? ? ? Recommendations/Treatment - 08/29/21 1517   ? ?  ? Swallow Evaluation Recommendations  ? SLP Diet Recommendations Thin;Age appropriate regular   chop meats  ? Liquid Administration via Cup;Straw   ? Medication Administration Whole meds with puree   ? Supervision Patient able to self feed   ? Compensations Multiple dry swallows after each bite/sip   ? Postural Changes Seated upright at 90 degrees;Remain upright for at least 30 minutes after feeds/meals   ? ?  ?  ? ?  ? ? ? Prognosis - 08/29/21 1518   ? ?  ? Prognosis  ? Prognosis for Safe Diet Advancement Good   ?  ? Individuals Consulted  ? Consulted and Agree with  Results and Recommendations Patient   ? Report Sent to  Referring physician   ? ?  ?  ? ?  ? ? ?Problem List ?Patient Active Problem List  ? Diagnosis Date Noted  ? Dysphagia 06/17/2021  ? Abnormal CT scan, col

## 2021-09-20 ENCOUNTER — Encounter (HOSPITAL_COMMUNITY): Payer: Self-pay | Admitting: Emergency Medicine

## 2021-09-20 ENCOUNTER — Emergency Department (HOSPITAL_COMMUNITY)
Admission: EM | Admit: 2021-09-20 | Discharge: 2021-09-20 | Disposition: A | Discharge status: Home / Self Care | Payer: Medicare HMO | Attending: Emergency Medicine | Admitting: Emergency Medicine

## 2021-09-20 DIAGNOSIS — L729 Follicular cyst of the skin and subcutaneous tissue, unspecified: Secondary | ICD-10-CM | POA: Diagnosis not present

## 2021-09-20 DIAGNOSIS — L720 Epidermal cyst: Secondary | ICD-10-CM

## 2021-09-20 DIAGNOSIS — L989 Disorder of the skin and subcutaneous tissue, unspecified: Secondary | ICD-10-CM | POA: Diagnosis present

## 2021-09-20 NOTE — Discharge Instructions (Signed)
Contact a health care provider if: Your cyst develops symptoms of infection. Your condition is not improving or is getting worse. You develop a cyst that looks different from other cysts you have had. You have a fever. Get help right away if: Redness spreads from the cyst into the surrounding area.

## 2021-09-20 NOTE — ED Provider Notes (Signed)
[facility] EMERGENCY DEPARTMENT Provider Note   CSN: [id number] Arrival date & time: 09/20/21  1357     History  Chief Complaint  Patient presents with   Facial Lesions    Ryan Hansen is a 88 y.o. male who presents to the emergency department with a chief complaint of lesion of the left neck.  Patient states that about 2 days ago he noticed a bump on his left neck he has never noticed before.  It is not painful.  He denies any swelling in his throat, difficulty swallowing, ear pain.  HPI     Home Medications Prior to Admission medications   Medication Sig Start Date End Date Taking? Authorizing Provider  budesonide-formoterol (SYMBICORT) 160-4.5 MCG/ACT inhaler Use 2 puffs twice daily to prevent cough or wheeze. Rinse, gargle and spit after use. 06/19/21  Yes Emokpae, Courage, MD  mirtazapine (REMERON) 15 MG tablet Take 0.5 tablets (7.5 mg total) by mouth at bedtime. 06/19/21  Yes Emokpae, Courage, MD  cetirizine (ZYRTEC) 5 MG tablet Take 1 tablet (5 mg total) by mouth daily. Patient not taking: Reported on 09/20/2021 06/16/21   Jaynee Eagles, PA-C  isosorbide mononitrate (ISMO) 10 MG tablet Take 0.5 tablets (5 mg total) by mouth 3 (three) times daily before meals. Patient not taking: Reported on 09/20/2021 06/19/21   Roxan Hockey, MD  LORazepam (ATIVAN) 0.5 MG tablet Take 1 tablet (0.5 mg total) by mouth every 12 (twelve) hours as needed for anxiety. Patient not taking: Reported on 09/20/2021 06/19/21 06/19/22  Roxan Hockey, MD  ondansetron (ZOFRAN-ODT) 4 MG disintegrating tablet Take 1 tablet (4 mg total) by mouth every 8 (eight) hours as needed for nausea or vomiting. Patient not taking: Reported on 09/20/2021 06/19/21   Roxan Hockey, MD  pantoprazole (PROTONIX) 40 MG tablet Take 1 tablet (40 mg total) by mouth daily before breakfast. Patient not taking: Reported on 09/20/2021 06/20/21   Roxan Hockey, MD  PROAIR HFA [phone] Base) MCG/ACT inhaler Inhale 2 puffs into the  lungs every 6 (six) hours as needed for wheezing or shortness of breath. Patient not taking: Reported on 09/20/2021 06/19/21   Roxan Hockey, MD  simvastatin (ZOCOR) 40 MG tablet TAKE ONE (1) TABLET EACH DAY Patient not taking: Reported on 09/20/2021 06/21/15   Lelon Perla, MD  Spacer/Aero-Holding Chambers (AEROCHAMBER PLUS) inhaler Use as instructed 12/07/15   Valentina Shaggy, MD      Allergies    Procaine hcl    Review of Systems   Review of Systems  Physical Exam Updated Vital Signs BP (!) 176/80 (BP Location: Right Arm)   Pulse (!) 59   Temp 98 F (36.7 C) (Oral)   Resp 15   SpO2 97%  Physical Exam Vitals and nursing note reviewed.  Constitutional:      General: He is not in acute distress.    Appearance: He is well-developed. He is not diaphoretic.  HENT:     Head: Normocephalic and atraumatic.  Eyes:     General: No scleral icterus.    Conjunctiva/sclera: Conjunctivae normal.  Cardiovascular:     Rate and Rhythm: Normal rate and regular rhythm.     Heart sounds: Normal heart sounds.  Pulmonary:     Effort: Pulmonary effort is normal. No respiratory distress.     Breath sounds: Normal breath sounds.  Abdominal:     Palpations: Abdomen is soft.     Tenderness: There is no abdominal tenderness.  Musculoskeletal:     Cervical back:  Normal range of motion and neck supple.     Comments: 1 cm well-circumscribed mobile subcutaneous nodule with central black punctum consistent with epidermoid or sebaceous cyst of the neck.  No erythema or tenderness noted  Skin:    General: Skin is warm and dry.  Neurological:     Mental Status: He is alert.  Psychiatric:        Behavior: Behavior normal.    ED Results / Procedures / Treatments   Labs (all labs ordered are listed, but only abnormal results are displayed) Labs Reviewed - No data to display  EKG None  Radiology No results found.  Procedures Procedures    Medications Ordered in ED Medications -  No data to display  ED Course/ Medical Decision Making/ A&P                           Medical Decision Making 88 year old male who presents emergency department with chief complaint of cyst of the left neck.  This appears to be an epidermoid cyst.  No evidence of lymphadenopathy, infectious sebaceous cyst.  Will discharge with surgical follow-up, discussed return precautions.           Final Clinical Impression(s) / ED Diagnoses Final diagnoses:  None    Rx / DC Orders ED Discharge Orders     None         Margarita Mail, PA-C 40/98/11 [id number]    Gray, [place] P, DO 82/95/62 [id number]

## 2021-09-20 NOTE — ED Triage Notes (Signed)
Pt has lesion on left side of neck and face that is concerning to him.

## 2021-11-17 ENCOUNTER — Ambulatory Visit
Admission: EM | Admit: 2021-11-17 | Discharge: 2021-11-17 | Disposition: A | Discharge status: Home / Self Care | Payer: Medicare HMO

## 2021-11-17 ENCOUNTER — Encounter: Payer: Self-pay | Admitting: Emergency Medicine

## 2021-11-17 DIAGNOSIS — L720 Epidermal cyst: Secondary | ICD-10-CM | POA: Diagnosis not present

## 2021-11-17 NOTE — Discharge Instructions (Addendum)
Follow up with General Surgery if you would like further evaluation: [facility] [address] [place], [place]

## 2021-11-17 NOTE — ED Triage Notes (Signed)
Knot on left side of neck unsure of how long knot has been there.  States area does not hurt.

## 2021-11-17 NOTE — ED Provider Notes (Signed)
[facility]    CSN: [id number] Arrival date & time: 11/17/21  1051      History   Chief Complaint No chief complaint on file.   HPI Ryan Hansen is a 88 y.o. male.   The history is provided by the patient.   Patient presents for complaints of a "knot" to his left neck.  He does not recall how long the area has been present.  States that he thinks he was seen approximately 1 year ago for the same or similar symptoms.  Patient does not recall being seen in the ER for the same or similar complaint on 09/20/2021.  He denies pain, fever, redness, oozing, drainage, throat pain, difficulty swallowing, or ear pain.  When he was seen in the emergency department, he was referred to general surgery, states he has not followed up at this time.   Past Medical History:  Diagnosis Date   Aortic stenosis    Asthma    BPH (benign prostatic hyperplasia)    CAD (coronary artery disease)    catheterization with 60% DX1, less than 50% circumflex, 40% LRAS   Elevated cholesterol    GERD (gastroesophageal reflux disease)    Hypertension    Murmur 12/24/12    Patient Active Problem List   Diagnosis Date Noted   Dysphagia 06/17/2021   Abnormal CT scan, colon    Severe persistent asthma 12/07/2015   Chronic rhinitis 12/07/2015   CAD (coronary artery disease) 02/18/2014   Hyperlipidemia 02/18/2014   HEMORRHOIDS 09/22/2008   GERD 09/22/2008   ELEVATED PROSTATE SPECIFIC ANTIGEN 09/22/2008   HEMATOCHEZIA, HX OF 09/22/2008    Past Surgical History:  Procedure Laterality Date   CARDIAC CATHETERIZATION Left    ESOPHAGEAL DILATION  06/18/2021   Procedure: ESOPHAGEAL DILATION;  Surgeon: Rogene Houston, MD;  Location: [facility];  Service: Endoscopy;;   ESOPHAGOGASTRODUODENOSCOPY (EGD) WITH PROPOFOL N/A 06/18/2021   Procedure: ESOPHAGOGASTRODUODENOSCOPY (EGD) WITH PROPOFOL;  Surgeon: Rogene Houston, MD;  Location: [facility];  Service: Endoscopy;  Laterality: N/A;   No previous  surgery         Home Medications    Prior to Admission medications   Medication Sig Start Date End Date Taking? Authorizing Provider  budesonide-formoterol (SYMBICORT) 160-4.5 MCG/ACT inhaler Use 2 puffs twice daily to prevent cough or wheeze. Rinse, gargle and spit after use. 06/19/21   Emokpae, Courage, MD  cetirizine (ZYRTEC) 5 MG tablet Take 1 tablet (5 mg total) by mouth daily. Patient not taking: Reported on 09/20/2021 06/16/21   Jaynee Eagles, PA-C  isosorbide mononitrate (ISMO) 10 MG tablet Take 0.5 tablets (5 mg total) by mouth 3 (three) times daily before meals. Patient not taking: Reported on 09/20/2021 06/19/21   Roxan Hockey, MD  LORazepam (ATIVAN) 0.5 MG tablet Take 1 tablet (0.5 mg total) by mouth every 12 (twelve) hours as needed for anxiety. Patient not taking: Reported on 09/20/2021 06/19/21 06/19/22  Roxan Hockey, MD  mirtazapine (REMERON) 15 MG tablet Take 0.5 tablets (7.5 mg total) by mouth at bedtime. 06/19/21   Roxan Hockey, MD  ondansetron (ZOFRAN-ODT) 4 MG disintegrating tablet Take 1 tablet (4 mg total) by mouth every 8 (eight) hours as needed for nausea or vomiting. Patient not taking: Reported on 09/20/2021 06/19/21   Roxan Hockey, MD  pantoprazole (PROTONIX) 40 MG tablet Take 1 tablet (40 mg total) by mouth daily before breakfast. Patient not taking: Reported on 09/20/2021 06/20/21   Roxan Hockey, MD  PROAIR HFA 108 (  90 Base) MCG/ACT inhaler Inhale 2 puffs into the lungs every 6 (six) hours as needed for wheezing or shortness of breath. Patient not taking: Reported on 09/20/2021 06/19/21   Roxan Hockey, MD  simvastatin (ZOCOR) 40 MG tablet TAKE ONE (1) TABLET EACH DAY Patient not taking: Reported on 09/20/2021 06/21/15   Lelon Perla, MD  Spacer/Aero-Holding Chambers (AEROCHAMBER PLUS) inhaler Use as instructed 12/07/15   Valentina Shaggy, MD    Family History Family History  Problem Relation Age of Onset   Heart disease Other        No  family history   Allergic rhinitis Neg Hx    Asthma Neg Hx    Eczema Neg Hx    Immunodeficiency Neg Hx    Urticaria Neg Hx    Angioedema Neg Hx     Social History Social History   Tobacco Use   Smoking status: Never   Smokeless tobacco: Never  Substance Use Topics   Alcohol use: No   Drug use: No     Allergies   Procaine hcl   Review of Systems Review of Systems Per HPI  Physical Exam Triage Vital Signs ED Triage Vitals  Enc Vitals Group     BP 11/17/21 1059 (!) 170/74     Pulse Rate 11/17/21 1059 65     Resp 11/17/21 1059 18     Temp 11/17/21 1059 98.3 F (36.8 C)     Temp Source 11/17/21 1059 Temporal     SpO2 11/17/21 1059 94 %     Weight --      Height --      Head Circumference --      Peak Flow --      Pain Score 11/17/21 1100 0     Pain Loc --      Pain Edu? --      Excl. in [place]? --    No data found.  Updated Vital Signs BP (!) 170/74 (BP Location: Right Arm)   Pulse 65   Temp 98.3 F (36.8 C) (Temporal)   Resp 18   SpO2 94%   Visual Acuity Right Eye Distance:   Left Eye Distance:   Bilateral Distance:    Right Eye Near:   Left Eye Near:    Bilateral Near:     Physical Exam Vitals and nursing note reviewed.  Constitutional:      General: He is not in acute distress.    Appearance: Normal appearance.  HENT:     Head: Normocephalic.     Right Ear: Tympanic membrane, ear canal and external ear normal.     Left Ear: Tympanic membrane, ear canal and external ear normal.     Nose: Nose normal.     Mouth/Throat:     Mouth: Mucous membranes are moist.  Eyes:     Extraocular Movements: Extraocular movements intact.     Conjunctiva/sclera: Conjunctivae normal.     Pupils: Pupils are equal, round, and reactive to light.  Cardiovascular:     Rate and Rhythm: Normal rate and regular rhythm.     Pulses: Normal pulses.     Heart sounds: Normal heart sounds.  Pulmonary:     Effort: Pulmonary effort is normal. No respiratory distress.      Breath sounds: Normal breath sounds. No stridor. No wheezing, rhonchi or rales.  Abdominal:     General: Bowel sounds are normal.     Palpations: Abdomen is soft.  Musculoskeletal:  Cervical back: Normal range of motion. No rigidity or tenderness.  Lymphadenopathy:     Cervical: No cervical adenopathy.  Skin:    General: Skin is warm and dry.     Comments: 1 cm well-circumscribed mobile subcutaneous nodule with central black punctum consistent with epidermoid or sebaceous cyst of the neck.  No erythema or tenderness is present.  There is no oozing, drainage, fluctuance present.  No change in presentation since seen on 09/20/2021.  Neurological:     General: No focal deficit present.     Mental Status: He is alert and oriented to person, place, and time.  Psychiatric:        Mood and Affect: Mood normal.        Behavior: Behavior normal.      UC Treatments / Results  Labs (all labs ordered are listed, but only abnormal results are displayed) Labs Reviewed - No data to display  EKG   Radiology No results found.  Procedures Procedures (including critical care time)  Medications Ordered in UC Medications - No data to display  Initial Impression / Assessment and Plan / UC Course  I have reviewed the triage vital signs and the nursing notes.  Pertinent labs & imaging results that were available during my care of the patient were reviewed by me and considered in my medical decision making (see chart for details).  Patient presents with cyst on his left neck that has been present for an unknown amount of time per the patient's report.  Patient was seen on 09/20/2021 for the same or similar symptoms in the emergency department.  Patient was referred to general surgery, patient has not followed up at this time.  There appears to be no changes to the patient's symptoms, the area is not causing any discomfort, is not red or tender, and has not changed in its presentation.  Discussion  with patient whether he should consider refraining from intervention if the area is not bothersome to him.  The patient was provided information for general surgery.  Patient advised to follow-up with his PCP for further evaluation as needed. Final Clinical Impressions(s) / UC Diagnoses   Final diagnoses:  Epidermal cyst of neck     Discharge Instructions      Follow up with General Surgery if you would like further evaluation: [facility] [address] [place], [place]        ED Prescriptions   None    PDMP not reviewed this encounter.   Tish Men, NP 11/17/21 [phone]

## 2023-02-01 ENCOUNTER — Telehealth: Payer: Self-pay | Admitting: Internal Medicine

## 2023-02-01 NOTE — Telephone Encounter (Signed)
New patient approval

## 2023-02-02 NOTE — Telephone Encounter (Signed)
scheduled

## 2023-03-01 ENCOUNTER — Ambulatory Visit: Payer: Medicare HMO | Admitting: Internal Medicine
